# Patient Record
Sex: Female | Born: 1960 | Race: Black or African American | Hispanic: No | Smoking: Current every day smoker
Health system: Southern US, Community
[De-identification: ages and names within clinical notes are randomized; demographics above are authoritative.]

## PROBLEM LIST (undated history)

## (undated) DIAGNOSIS — I10 Essential (primary) hypertension: Secondary | ICD-10-CM

## (undated) DIAGNOSIS — I739 Peripheral vascular disease, unspecified: Secondary | ICD-10-CM

## (undated) DIAGNOSIS — L309 Dermatitis, unspecified: Secondary | ICD-10-CM

## (undated) DIAGNOSIS — E785 Hyperlipidemia, unspecified: Secondary | ICD-10-CM

## (undated) HISTORY — PX: BUNIONECTOMY: SHX129

## (undated) HISTORY — PX: TONSILLECTOMY: SUR1361

## (undated) HISTORY — PX: OTHER SURGICAL HISTORY: SHX169

---

## 2001-10-08 ENCOUNTER — Ambulatory Visit (HOSPITAL_COMMUNITY): Admission: RE | Admit: 2001-10-08 | Discharge: 2001-10-08 | Payer: Self-pay | Admitting: Family Medicine

## 2001-10-08 ENCOUNTER — Encounter: Payer: Self-pay | Admitting: Family Medicine

## 2014-05-17 ENCOUNTER — Other Ambulatory Visit (HOSPITAL_COMMUNITY): Payer: Self-pay | Admitting: Physician Assistant

## 2014-05-17 DIAGNOSIS — Z1231 Encounter for screening mammogram for malignant neoplasm of breast: Secondary | ICD-10-CM

## 2014-05-24 ENCOUNTER — Ambulatory Visit (HOSPITAL_COMMUNITY)
Admission: RE | Admit: 2014-05-24 | Discharge: 2014-05-24 | Disposition: A | Discharge status: Home / Self Care | Payer: Self-pay | Source: Ambulatory Visit | Attending: Physician Assistant | Admitting: Physician Assistant

## 2014-05-24 DIAGNOSIS — Z1231 Encounter for screening mammogram for malignant neoplasm of breast: Secondary | ICD-10-CM

## 2019-04-08 ENCOUNTER — Emergency Department (HOSPITAL_COMMUNITY)
Admission: EM | Admit: 2019-04-08 | Discharge: 2019-04-08 | Disposition: A | Discharge status: Home / Self Care | Payer: BC Managed Care – PPO | Attending: Emergency Medicine | Admitting: Emergency Medicine

## 2019-04-08 ENCOUNTER — Encounter (HOSPITAL_COMMUNITY): Payer: Self-pay | Admitting: Emergency Medicine

## 2019-04-08 ENCOUNTER — Other Ambulatory Visit: Payer: Self-pay

## 2019-04-08 DIAGNOSIS — F17210 Nicotine dependence, cigarettes, uncomplicated: Secondary | ICD-10-CM | POA: Insufficient documentation

## 2019-04-08 DIAGNOSIS — L089 Local infection of the skin and subcutaneous tissue, unspecified: Secondary | ICD-10-CM | POA: Insufficient documentation

## 2019-04-08 HISTORY — DX: Dermatitis, unspecified: L30.9

## 2019-04-08 MED ORDER — SULFAMETHOXAZOLE-TRIMETHOPRIM 800-160 MG PO TABS: 1.0000 | ORAL_TABLET | Freq: Two times a day (BID) | ORAL | 0 refills | Status: AC

## 2019-04-08 MED ORDER — SULFAMETHOXAZOLE-TRIMETHOPRIM 800-160 MG PO TABS
1.0000 | ORAL_TABLET | Freq: Once | ORAL | Status: AC
  Administered 2019-04-08: 15:00:00 1 via ORAL
  Filled 2019-04-08: qty 1

## 2019-04-08 NOTE — ED Notes (Signed)
Wet to dry dressing applied to open area to right lower leg.

## 2019-04-08 NOTE — ED Provider Notes (Addendum)
[facility] EMERGENCY DEPARTMENT Provider Note   CSN: [id number] Arrival date & time: 04/08/19  1338     History   Chief Complaint Chief Complaint  Patient presents with  . Wound Check    HPI Lauren Brown is a 58 y.o. female with history of eczema otherwise healthy presents today for wound of the left anterior lower leg.  Patient reports that she had a blister in this area 3 weeks ago that subsequently broke open and has been draining ever since.  She reports a mild throbbing pain to the area constant worsened with palpation without alleviating factors, no radiation of pain.  She reports a scant purulent drainage from the area that has been constant since it opened.  Denies fever/chills, headache, chest pain/shortness of breath, abdominal pain, nausea/vomiting, diarrhea, rash, insect/animal bite, tick bite, numbness/weakness, tingling or any additional concerns.     HPI  Past Medical History:  Diagnosis Date  . Eczema     There are no active problems to display for this patient.   Past Surgical History:  Procedure Laterality Date  . BUNIONECTOMY    . TONSILLECTOMY       OB History   No obstetric history on file.      Home Medications    Prior to Admission medications   Medication Sig Start Date End Date Taking? Authorizing Provider  augmented betamethasone dipropionate (DIPROLENE-AF) 0.05 % cream APPLY CREAM TOPICALLY TO HANDS TWICE DAILY AS NEEDED FOR ECZEMA NOT TO FACE GROIN OR UNDERARMS 02/11/19  Yes [provider]  triamcinolone cream (KENALOG) 0.1 % APPLY CREAM EXTERNALLY TWICE DAILY 10/17/18  Yes [provider]  sulfamethoxazole-trimethoprim (BACTRIM DS) 800-160 MG tablet Take 1 tablet by mouth 2 (two) times daily for 7 days. 04/08/19 04/15/19  Bill Salinas, PA-C    Family History History reviewed. No pertinent family history.  Social History Social History   Tobacco Use  . Smoking status: Current Every Day Smoker  .  Smokeless tobacco: Never Used  Substance Use Topics  . Alcohol use: Never    Frequency: Never  . Drug use: Never     Allergies   Patient has no known allergies.   Review of Systems Review of Systems Ten systems are reviewed and are negative for acute change except as noted in the HPI  Physical Exam Updated Vital Signs BP (!) 159/95 (BP Location: Right Arm)   Pulse 69   Temp 98.2 F (36.8 C) (Oral)   Resp 18   Ht 5\' 7"  (1.702 m)   Wt 79.4 kg   SpO2 99%   BMI 27.41 kg/m   Physical Exam Constitutional:      General: She is not in acute distress.    Appearance: Normal appearance. She is well-developed. She is not ill-appearing or diaphoretic.  HENT:     Head: Normocephalic and atraumatic.     Right Ear: External ear normal.     Left Ear: External ear normal.     Nose: Nose normal.  Eyes:     General: Vision grossly intact. Gaze aligned appropriately.     Pupils: Pupils are equal, round, and reactive to light.  Neck:     Musculoskeletal: Normal range of motion.     Trachea: Trachea and phonation normal. No tracheal deviation.  Cardiovascular:     Rate and Rhythm: Normal rate and regular rhythm.     Pulses:          Dorsalis pedis pulses are 2+ on the right  side and 2+ on the left side.  Pulmonary:     Effort: Pulmonary effort is normal. No respiratory distress.  Abdominal:     General: There is no distension.     Palpations: Abdomen is soft.     Tenderness: There is no abdominal tenderness. There is no guarding or rebound.  Musculoskeletal: Normal range of motion.  Feet:     Right foot:     Protective Sensation: 3 sites tested. 3 sites sensed.     Left foot:     Protective Sensation: 3 sites tested. 3 sites sensed.  Skin:    General: Skin is warm and dry.          Comments: 1.5 cm diameter wound of the left anterior lower leg with purulent drainage present.  Some hyperpigmented surrounding skin.  No streaking.  No fluctuance.  Neurological:     Mental  Status: She is alert.     GCS: GCS eye subscore is 4. GCS verbal subscore is 5. GCS motor subscore is 6.     Comments: Speech is clear and goal oriented, follows commands Major Cranial nerves without deficit, no facial droop Moves extremities without ataxia, coordination intact  Psychiatric:        Behavior: Behavior normal.      ED Treatments / Results  Labs (all labs ordered are listed, but only abnormal results are displayed) Labs Reviewed  AEROBIC/ANAEROBIC CULTURE (SURGICAL/DEEP WOUND)    EKG None  Radiology No results found.  Procedures Procedures (including critical care time)  Medications Ordered in ED Medications  sulfamethoxazole-trimethoprim (BACTRIM DS) 800-160 MG per tablet 1 tablet (1 tablet Oral Given 04/08/19 1433)     Initial Impression / Assessment and Plan / ED Course  I have reviewed the triage vital signs and the nursing notes.  Pertinent labs & imaging results that were available during my care of the patient were reviewed by me and considered in my medical decision making (see chart for details).    58 year old female with a three-week wound of the left anterior lower leg approximate 1.5 cm in diameter with purulent drainage.  No indication for incision and drainage as it is freely draining today.  She has no known immunocompromising diseases.  No systemic symptoms, well appearing, reassuring vital signs.  She is neurovascular intact to the extremity without sign of DVT, septic arthritis or compartment syndrome, does not meet SIRS crietera.  Wound to be dressed by nursing staff with wet-to-dry dressing.  Patient encouraged to continue this daily until infection resolves.  She has been started on Bactrim today and encouraged to follow-up with PCP and wound care center.  Referrals given.  Wound culture collected.  Of note patient denies chance of pregnancy today, she reports that she is postmenopausal and abstinent.  Patient states understanding that  medications given or prescribed today may result in harm to of a pregnancy and she accepts these risks and still chooses not to be pregnancy tested and proceed with medications.  Additionally patient denies history of CKD or gastric ulcers.  At this time there does not appear to be any evidence of an acute emergency medical condition and the patient appears stable for discharge with appropriate outpatient follow up. Diagnosis was discussed with patient who verbalizes understanding of care plan and is agreeable to discharge. I have discussed return precautions with patient who verbalizes understanding of return precautions. Patient encouraged to follow-up with their PCP and wound center. All questions answered. Patient has been discharged in  good condition.  Patient seen and evaluated by Dr. Sabra Heck during this visit who agrees with Bactrim and outpatient follow-up.  Note: Portions of this report may have been transcribed using voice recognition software. Every effort was made to ensure accuracy; however, inadvertent computerized transcription errors may still be present. Final Clinical Impressions(s) / ED Diagnoses   Final diagnoses:  Wound infection    ED Discharge Orders         Ordered    sulfamethoxazole-trimethoprim (BACTRIM DS) 800-160 MG tablet  2 times daily     04/08/19 1458           Deliah Boston, PA-C 04/08/19 1502    Deliah Boston, PA-C 04/08/19 1519    Noemi Chapel, MD 04/09/19 [phone]

## 2019-04-08 NOTE — Discharge Instructions (Signed)
You have been diagnosed today with wound infection.  At this time there does not appear to be the presence of an emergent medical condition, however there is always the potential for conditions to change. Please read and follow the below instructions.  Please return to the Emergency Department immediately for any new or worsening symptoms. Please be sure to follow up with your Primary Care Provider within one week regarding your visit today; please call their office to schedule an appointment even if you are feeling better for a follow-up visit.  You may call the [facility] on your discharge paperwork or use the resource guide given to you today to find a local primary care doctor. Please continue to use the wound care techniques and wet to dry dressing taught to you today to help with your wound.  Elevation of your leg may also help with your symptoms. Please take the antibiotic Bactrim as prescribed to help treat your infection.  Be sure to drink plenty of water while taking this medication.  Get help right away if: You have a red streak going away from your wound. The edges of the wound open up and separate. Your wound is bleeding, and the bleeding does not stop with gentle pressure. You have a rash. You faint. You have trouble breathing. You have fever or chills You have any new/concerning or worsening symptoms.  Please read the additional information packets attached to your discharge summary.  Do not take your medicine if  develop an itchy rash, swelling in your mouth or lips, or difficulty breathing; call 911 and seek immediate emergency medical attention if this occurs.  Note: Portions of this text may have been transcribed using voice recognition software. Every effort was made to ensure accuracy; however, inadvertent computerized transcription errors may still be present.

## 2019-04-08 NOTE — ED Provider Notes (Signed)
The patient has a wound to the left lower extremity, has opened up after a blister formed, has an ongoing open wound which is somewhat tender to palpation, minimal surrounding redness, no fevers.  Will treat with antibiotics and have close follow-up.  Will be given family doctor follow-up for the area.  Patient agreeable.  Medical screening examination/treatment/procedure(s) were conducted as a shared visit with non-physician practitioner(s) and myself.  I personally evaluated the patient during the encounter.  Clinical Impression:   Final diagnoses:  Wound infection         Noemi Chapel, MD 04/09/19 [phone]

## 2019-04-08 NOTE — ED Triage Notes (Signed)
Patient states she had a blister on her left lower leg 3 weeks ago and it's now an open wound that won't heal. Complaining of swelling to area around wound.

## 2019-04-13 LAB — AEROBIC/ANAEROBIC CULTURE W GRAM STAIN (SURGICAL/DEEP WOUND): Special Requests: NORMAL

## 2019-04-13 LAB — AEROBIC/ANAEROBIC CULTURE (SURGICAL/DEEP WOUND)

## 2019-04-14 ENCOUNTER — Telehealth: Payer: Self-pay | Admitting: *Deleted

## 2019-04-14 NOTE — Telephone Encounter (Signed)
Post ED Visit - Positive Culture Follow-up  Culture report reviewed by antimicrobial stewardship pharmacist: [place] Team []  Elenor Quinones, Pharm.D. []  Heide Guile, Pharm.D., BCPS AQ-ID []  Parks Neptune, Pharm.D., BCPS []  Alycia Rossetti, Pharm.D., BCPS []  [place], Pharm.D., BCPS, AAHIVP []  Legrand Como, Pharm.D., BCPS, AAHIVP []  Salome Arnt, PharmD, BCPS []  Johnnette Gourd, PharmD, BCPS []  Hughes Better, PharmD, BCPS []  Leeroy Cha, PharmD []  Laqueta Linden, PharmD, BCPS []  Albertina Parr, PharmD  [place] Team []  Leodis Sias, PharmD []  Lindell Spar, PharmD []  Royetta Asal, PharmD []  Graylin Shiver, Rph []  Rema Fendt) Glennon Mac, PharmD []  Arlyn Dunning, PharmD []  Netta Cedars, PharmD []  Dia Sitter, PharmD []  Leone Haven, PharmD []  Gretta Arab, PharmD []  Theodis Shove, PharmD []  Peggyann Juba, PharmD []  Reuel Boom, PharmD   Positive wound culture, Duanne Limerick. PharmD  Treated with Sulfamethoxazole-Trimethoprim, organism sensitive to the same and no further patient follow-up is required at this time.  Harlon Flor [facility] 04/14/2019, 10:59 AM

## 2019-05-01 ENCOUNTER — Encounter (HOSPITAL_BASED_OUTPATIENT_CLINIC_OR_DEPARTMENT_OTHER): Payer: BC Managed Care – PPO | Admitting: Internal Medicine

## 2019-06-16 DIAGNOSIS — L98499 Non-pressure chronic ulcer of skin of other sites with unspecified severity: Secondary | ICD-10-CM | POA: Insufficient documentation

## 2019-06-16 DIAGNOSIS — Z720 Tobacco use: Secondary | ICD-10-CM | POA: Insufficient documentation

## 2019-06-27 ENCOUNTER — Emergency Department (HOSPITAL_COMMUNITY): Payer: BC Managed Care – PPO

## 2019-06-27 ENCOUNTER — Inpatient Hospital Stay (HOSPITAL_COMMUNITY)
Admission: EM | Admit: 2019-06-27 | Discharge: 2019-07-01 | DRG: 871 | Disposition: A | Discharge status: Home Health | Payer: BC Managed Care – PPO | Attending: Internal Medicine | Admitting: Internal Medicine

## 2019-06-27 ENCOUNTER — Other Ambulatory Visit: Payer: Self-pay

## 2019-06-27 DIAGNOSIS — I6330 Cerebral infarction due to thrombosis of unspecified cerebral artery: Secondary | ICD-10-CM | POA: Insufficient documentation

## 2019-06-27 DIAGNOSIS — R739 Hyperglycemia, unspecified: Secondary | ICD-10-CM | POA: Diagnosis present

## 2019-06-27 DIAGNOSIS — X58XXXA Exposure to other specified factors, initial encounter: Secondary | ICD-10-CM | POA: Diagnosis not present

## 2019-06-27 DIAGNOSIS — G9341 Metabolic encephalopathy: Secondary | ICD-10-CM | POA: Diagnosis present

## 2019-06-27 DIAGNOSIS — I639 Cerebral infarction, unspecified: Secondary | ICD-10-CM | POA: Diagnosis present

## 2019-06-27 DIAGNOSIS — D649 Anemia, unspecified: Secondary | ICD-10-CM | POA: Diagnosis present

## 2019-06-27 DIAGNOSIS — I10 Essential (primary) hypertension: Secondary | ICD-10-CM | POA: Diagnosis present

## 2019-06-27 DIAGNOSIS — Z95820 Peripheral vascular angioplasty status with implants and grafts: Secondary | ICD-10-CM

## 2019-06-27 DIAGNOSIS — L309 Dermatitis, unspecified: Secondary | ICD-10-CM | POA: Diagnosis present

## 2019-06-27 DIAGNOSIS — Z8249 Family history of ischemic heart disease and other diseases of the circulatory system: Secondary | ICD-10-CM

## 2019-06-27 DIAGNOSIS — Z8661 Personal history of infections of the central nervous system: Secondary | ICD-10-CM

## 2019-06-27 DIAGNOSIS — A4101 Sepsis due to Methicillin susceptible Staphylococcus aureus: Principal | ICD-10-CM | POA: Diagnosis present

## 2019-06-27 DIAGNOSIS — F17200 Nicotine dependence, unspecified, uncomplicated: Secondary | ICD-10-CM | POA: Diagnosis present

## 2019-06-27 DIAGNOSIS — Z79899 Other long term (current) drug therapy: Secondary | ICD-10-CM | POA: Diagnosis not present

## 2019-06-27 DIAGNOSIS — E876 Hypokalemia: Secondary | ICD-10-CM | POA: Diagnosis present

## 2019-06-27 DIAGNOSIS — Z20822 Contact with and (suspected) exposure to covid-19: Secondary | ICD-10-CM | POA: Diagnosis present

## 2019-06-27 DIAGNOSIS — E0781 Sick-euthyroid syndrome: Secondary | ICD-10-CM | POA: Diagnosis present

## 2019-06-27 DIAGNOSIS — I739 Peripheral vascular disease, unspecified: Secondary | ICD-10-CM | POA: Diagnosis present

## 2019-06-27 DIAGNOSIS — L03116 Cellulitis of left lower limb: Secondary | ICD-10-CM | POA: Diagnosis present

## 2019-06-27 DIAGNOSIS — G9340 Encephalopathy, unspecified: Secondary | ICD-10-CM | POA: Diagnosis not present

## 2019-06-27 DIAGNOSIS — E785 Hyperlipidemia, unspecified: Secondary | ICD-10-CM | POA: Diagnosis present

## 2019-06-27 DIAGNOSIS — E538 Deficiency of other specified B group vitamins: Secondary | ICD-10-CM | POA: Diagnosis present

## 2019-06-27 DIAGNOSIS — R4182 Altered mental status, unspecified: Secondary | ICD-10-CM | POA: Diagnosis present

## 2019-06-27 DIAGNOSIS — S81802A Unspecified open wound, left lower leg, initial encounter: Secondary | ICD-10-CM | POA: Diagnosis not present

## 2019-06-27 DIAGNOSIS — R7881 Bacteremia: Secondary | ICD-10-CM | POA: Diagnosis not present

## 2019-06-27 DIAGNOSIS — R509 Fever, unspecified: Secondary | ICD-10-CM | POA: Diagnosis not present

## 2019-06-27 DIAGNOSIS — B9561 Methicillin susceptible Staphylococcus aureus infection as the cause of diseases classified elsewhere: Secondary | ICD-10-CM | POA: Diagnosis present

## 2019-06-27 DIAGNOSIS — I2510 Atherosclerotic heart disease of native coronary artery without angina pectoris: Secondary | ICD-10-CM | POA: Diagnosis present

## 2019-06-27 DIAGNOSIS — L089 Local infection of the skin and subcutaneous tissue, unspecified: Secondary | ICD-10-CM | POA: Diagnosis not present

## 2019-06-27 DIAGNOSIS — R410 Disorientation, unspecified: Secondary | ICD-10-CM

## 2019-06-27 DIAGNOSIS — I6522 Occlusion and stenosis of left carotid artery: Secondary | ICD-10-CM | POA: Diagnosis not present

## 2019-06-27 HISTORY — DX: Hyperlipidemia, unspecified: E78.5

## 2019-06-27 HISTORY — DX: Peripheral vascular disease, unspecified: I73.9

## 2019-06-27 HISTORY — DX: Essential (primary) hypertension: I10

## 2019-06-27 LAB — COMPREHENSIVE METABOLIC PANEL
ALT: 13 U/L (ref 0–44)
AST: 18 U/L (ref 15–41)
Albumin: 3.5 g/dL (ref 3.5–5.0)
Alkaline Phosphatase: 82 U/L (ref 38–126)
Anion gap: 14 (ref 5–15)
BUN: 12 mg/dL (ref 6–20)
CO2: 22 mmol/L (ref 22–32)
Calcium: 8.3 mg/dL — ABNORMAL LOW (ref 8.9–10.3)
Chloride: 97 mmol/L — ABNORMAL LOW (ref 98–111)
Creatinine, Ser: 0.88 mg/dL (ref 0.44–1.00)
GFR calc Af Amer: >60 mL/min (ref >60)
GFR calc non Af Amer: >60 mL/min (ref >60)
Glucose, Bld: 117 mg/dL — ABNORMAL HIGH (ref 70–99)
Potassium: 3.3 mmol/L — ABNORMAL LOW (ref 3.5–5.1)
Sodium: 133 mmol/L — ABNORMAL LOW (ref 135–145)
Total Bilirubin: 1.3 mg/dL — ABNORMAL HIGH (ref 0.3–1.2)
Total Protein: 7.9 g/dL (ref 6.5–8.1)

## 2019-06-27 LAB — APTT: aPTT: 31 seconds (ref 24–36)

## 2019-06-27 LAB — PROCALCITONIN: Procalcitonin: 2.5 ng/mL

## 2019-06-27 LAB — URINALYSIS, ROUTINE W REFLEX MICROSCOPIC
Bilirubin Urine: NEGATIVE
Glucose, UA: NEGATIVE mg/dL
Ketones, ur: 5 mg/dL — AB
Leukocytes,Ua: NEGATIVE
Nitrite: NEGATIVE
Protein, ur: 30 mg/dL — AB
Specific Gravity, Urine: 1.016 (ref 1.005–1.030)
pH: 5 (ref 5.0–8.0)

## 2019-06-27 LAB — CBC WITH DIFFERENTIAL/PLATELET
Abs Immature Granulocytes: 0.08 10*3/uL — ABNORMAL HIGH (ref 0.00–0.07)
Basophils Absolute: 0 10*3/uL (ref 0.0–0.1)
Basophils Relative: 0 %
Eosinophils Absolute: 0 10*3/uL (ref 0.0–0.5)
Eosinophils Relative: 0 %
HCT: 39.8 % (ref 36.0–46.0)
Hemoglobin: 12.6 g/dL (ref 12.0–15.0)
Immature Granulocytes: 1 %
Lymphocytes Relative: 6 %
Lymphs Abs: 1.1 10*3/uL (ref 0.7–4.0)
MCH: 27.3 pg (ref 26.0–34.0)
MCHC: 31.7 g/dL (ref 30.0–36.0)
MCV: 86.1 fL (ref 80.0–100.0)
Monocytes Absolute: 0.7 10*3/uL (ref 0.1–1.0)
Monocytes Relative: 4 %
Neutro Abs: 15.3 10*3/uL — ABNORMAL HIGH (ref 1.7–7.7)
Neutrophils Relative %: 89 %
Platelets: 264 10*3/uL (ref 150–400)
RBC: 4.62 MIL/uL (ref 3.87–5.11)
RDW: 13.2 % (ref 11.5–15.5)
WBC: 17.1 10*3/uL — ABNORMAL HIGH (ref 4.0–10.5)
nRBC: 0 % (ref 0.0–0.2)

## 2019-06-27 LAB — POC URINE PREG, ED: Preg Test, Ur: NEGATIVE

## 2019-06-27 LAB — PROTIME-INR
INR: 1.2 (ref 0.8–1.2)
Prothrombin Time: 15.4 seconds — ABNORMAL HIGH (ref 11.4–15.2)

## 2019-06-27 LAB — D-DIMER, QUANTITATIVE: D-Dimer, Quant: 2.22 ug/mL-FEU — ABNORMAL HIGH (ref 0.00–0.50)

## 2019-06-27 LAB — C-REACTIVE PROTEIN: CRP: 11.1 mg/dL — ABNORMAL HIGH (ref <1.0)

## 2019-06-27 LAB — SEDIMENTATION RATE: Sed Rate: 41 mm/hr — ABNORMAL HIGH (ref 0–22)

## 2019-06-27 LAB — LACTIC ACID, PLASMA: Lactic Acid, Venous: 1.8 mmol/L (ref 0.5–1.9)

## 2019-06-27 IMAGING — DX DG CHEST 1V PORT
1 series · 1 of 1 positions shown · non-contrast
Comparison: [DATE], report only

CLINICAL DATA: Fever.  Left leg wound.

EXAM:
PORTABLE CHEST 1 VIEW

[chest ap]
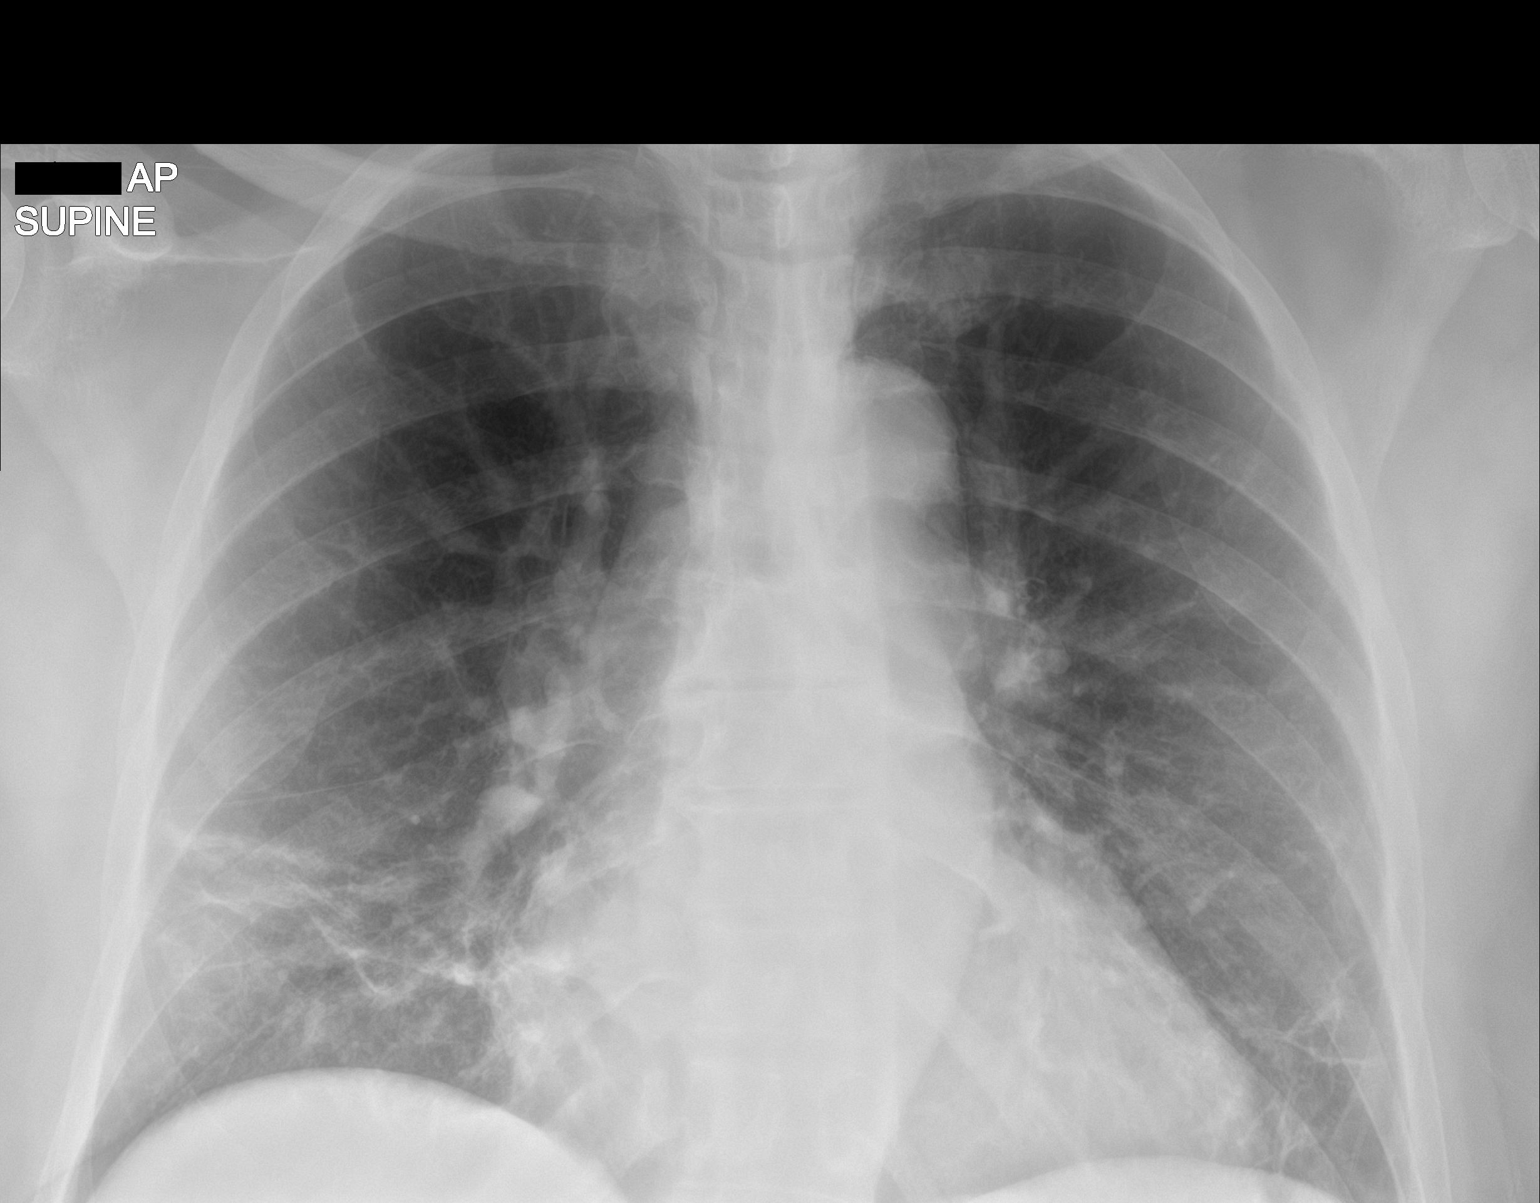

[1 of 1 positions shown; findings below may reference images not displayed]

FINDINGS: AP portable chest film. The costophrenic angles and inferior-most
chest are excluded. Midline trachea. Mild cardiomegaly. Tortuous
thoracic aorta. No large pleural fluid. No pneumothorax. Basilar
predominant areas of linear interstitial thickening and scarring. No
lobar consolidation.
IMPRESSION: No acute cardiopulmonary disease.

Cardiomegaly without congestive failure.

## 2019-06-27 IMAGING — CT CT HEAD W/O CM
4 series · 17 of 47 positions shown, 19 images · non-contrast
Comparison: None.

CLINICAL DATA: Stroke-like symptoms

EXAM:
CT HEAD WITHOUT CONTRAST
TECHNIQUE: Contiguous axial images were obtained from the base of the skull
through the vertex without intravenous contrast.

[Series 2: head w o · axial · 0.47mm/px · z∈[+758,+873]mm · 7 of 31 slices shown, 9 images]
[im 4/31  brain]
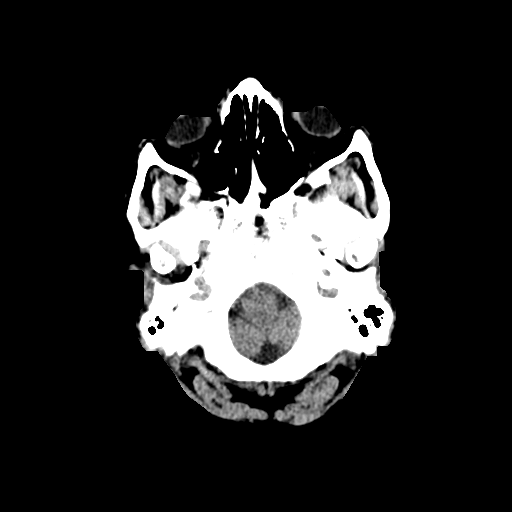
[im 4/31  bone]
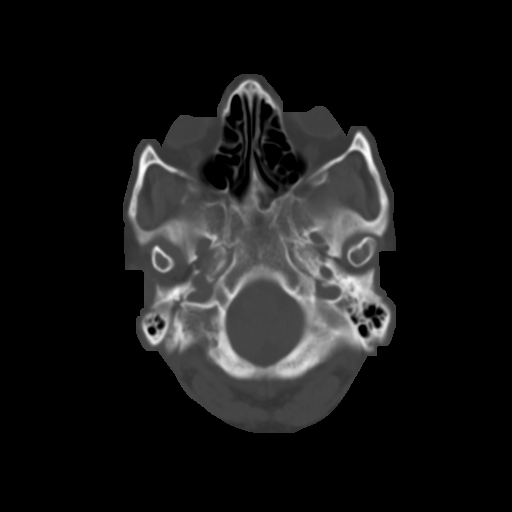
[im 8/31  brain]
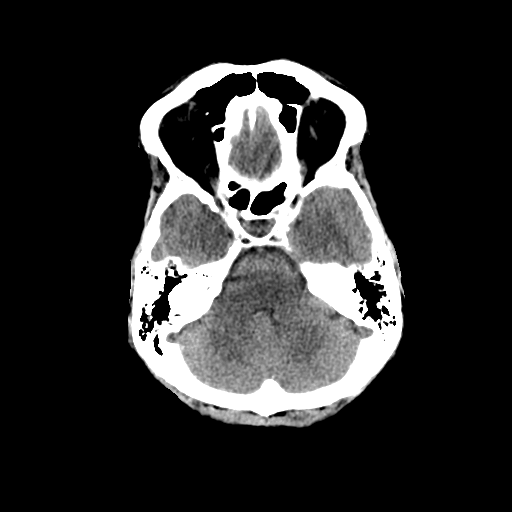
[im 12/31  brain]
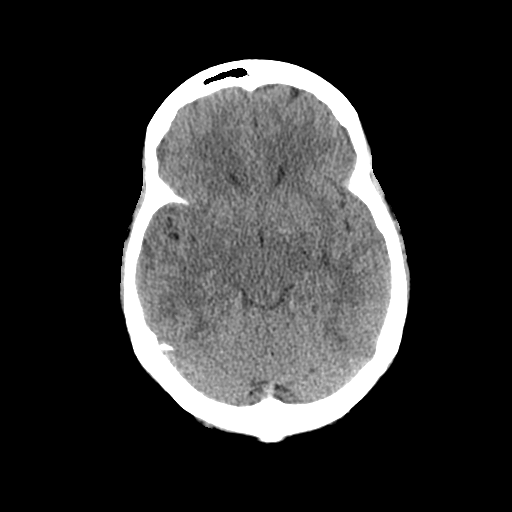
[im 16/31  brain]
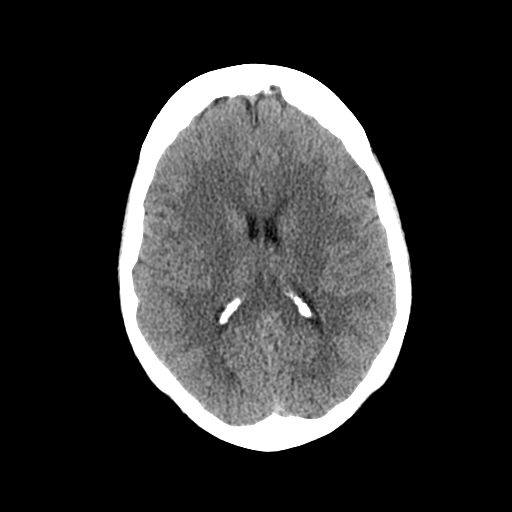
[im 19/31  brain]
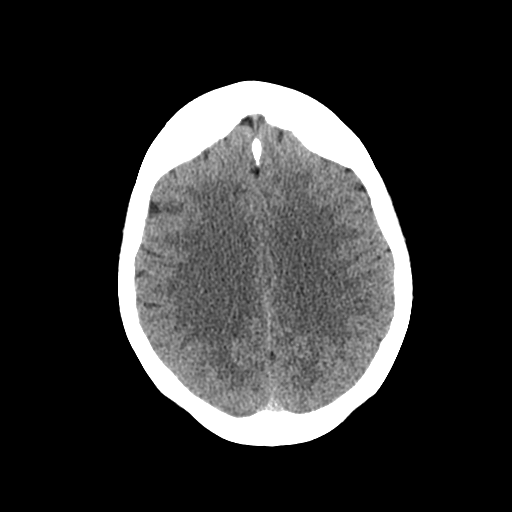
[im 19/31  bone]
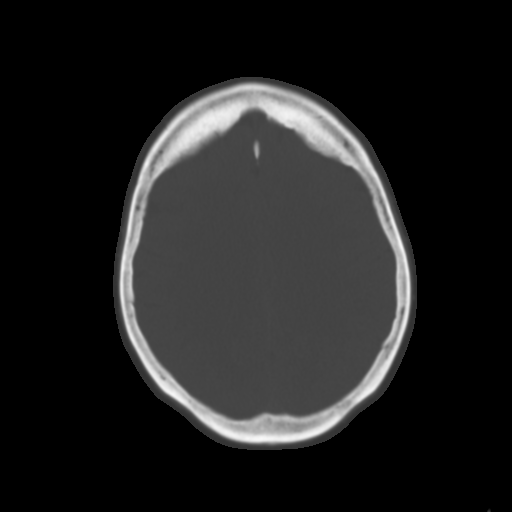
[im 23/31  brain]
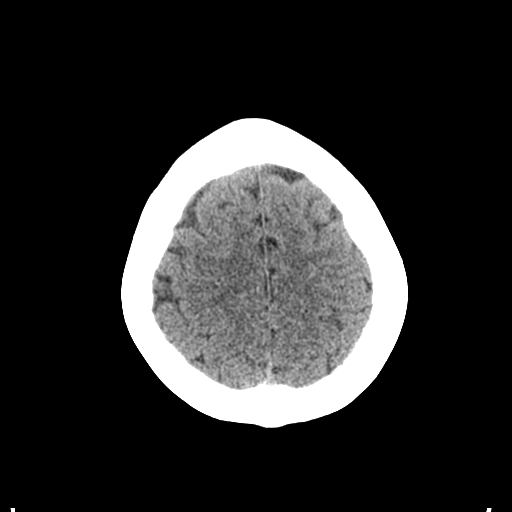
[im 27/31  brain]
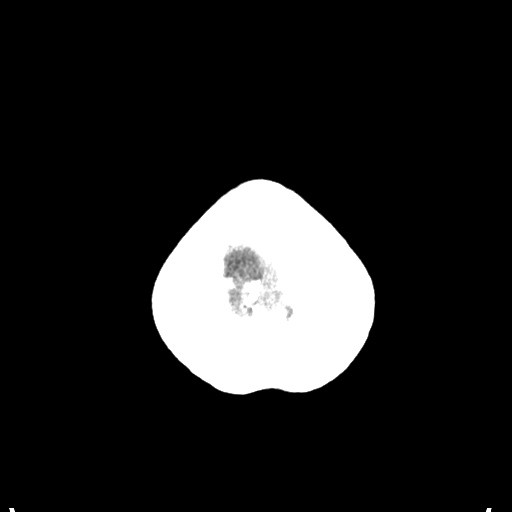

[Series 3: head bone · axial · 0.47mm/px · z∈[+757,+811]mm · 4 of 78 slices shown]
[im 8/78  bone]
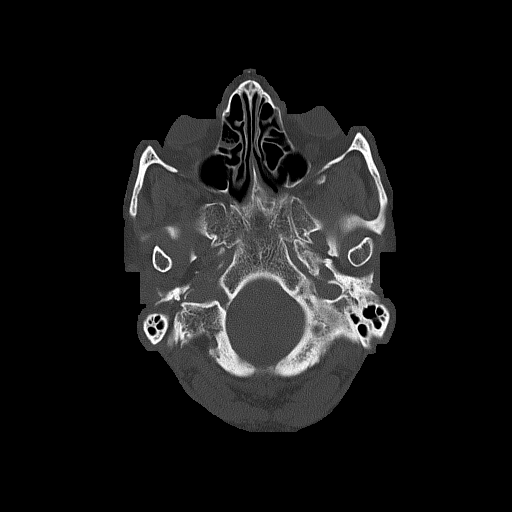
[im 16/78  bone]
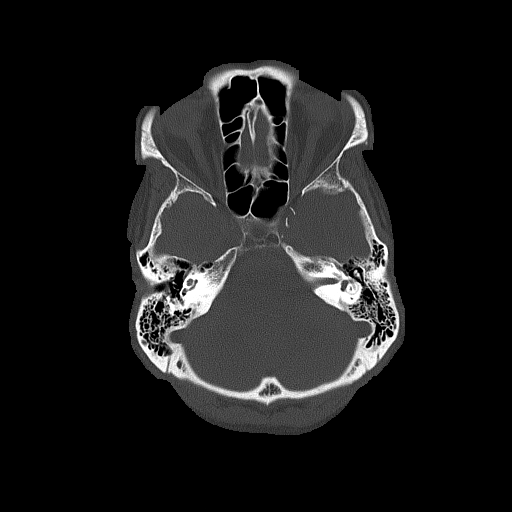
[im 24/78  bone]
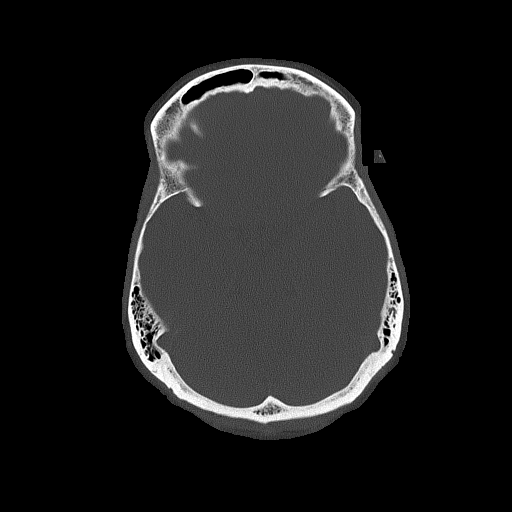
[im 35/78  bone]
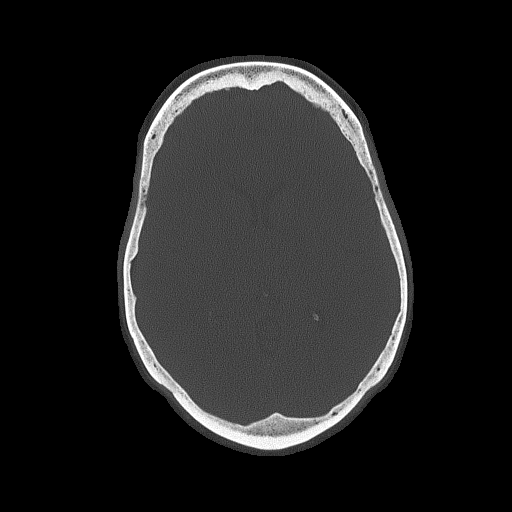

[Series 4: coronal soft · coronal · 0.33mm/px · 3 of 70 slices shown]
[im 24/70  brain]
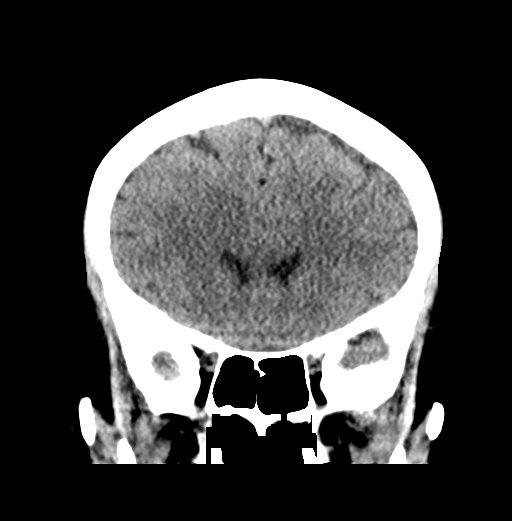
[im 31/70  brain]
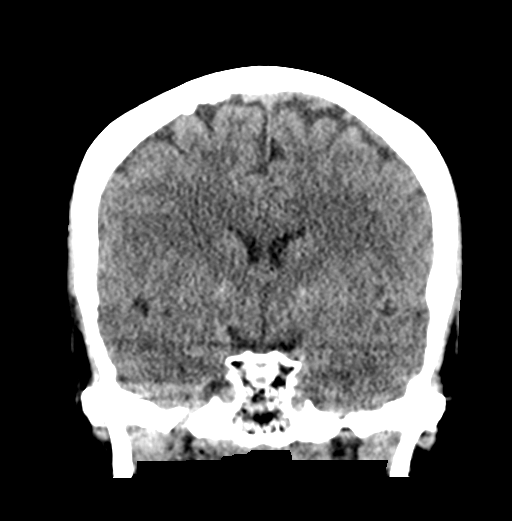
[im 39/70  brain]
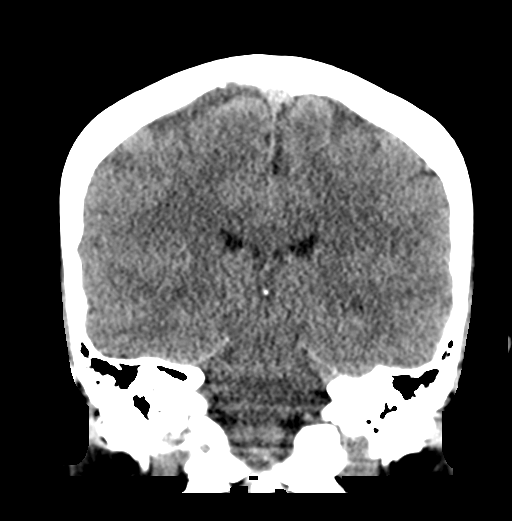

[Series 5: sagittal soft · sagittal · 0.35mm/px · 3 of 55 slices shown]
[im 19/55  brain]
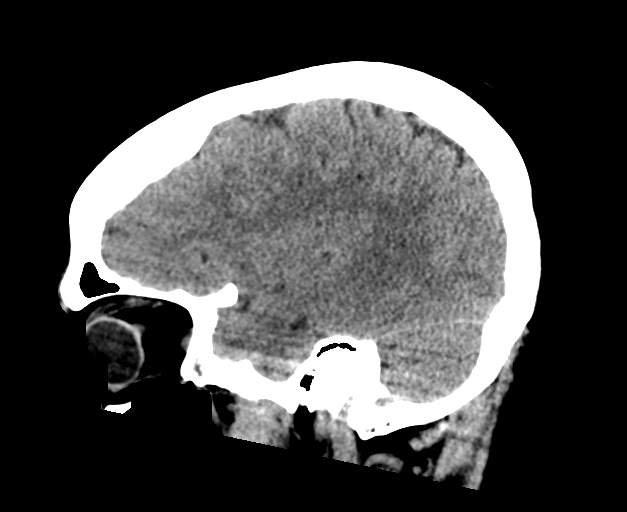
[im 28/55  brain]
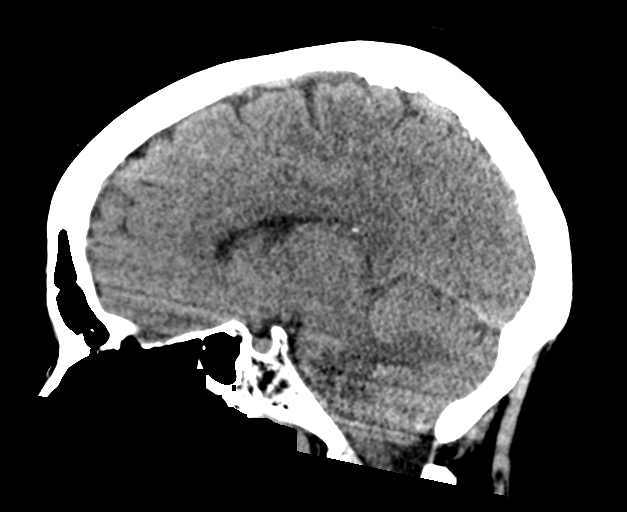
[im 37/55  brain]
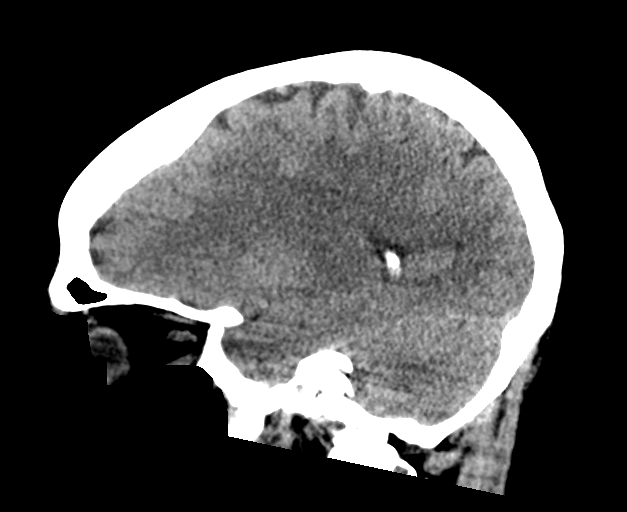

[17 of 47 positions shown; findings below may reference images not displayed]

FINDINGS: Brain: No evidence of acute infarction, hemorrhage, hydrocephalus,
extra-axial collection or mass lesion/mass effect.

Vascular: No hyperdense vessel or unexpected calcification.

Skull: Normal. Negative for fracture or focal lesion.

Sinuses/Orbits: No acute finding.

Other: None.
IMPRESSION: No acute intracranial abnormality noted.

## 2019-06-27 MED ORDER — COLLAGENASE 250 UNIT/GM EX OINT
1.0000 "application " | TOPICAL_OINTMENT | CUTANEOUS | Status: DC
  Administered 2019-07-01: 1 via TOPICAL
  Filled 2019-06-27 (×2): qty 30

## 2019-06-27 MED ORDER — SODIUM CHLORIDE 0.9 % IV SOLN
2.0000 g | Freq: Once | INTRAVENOUS | Status: AC
  Administered 2019-06-27: 2 g via INTRAVENOUS
  Filled 2019-06-27: qty 20

## 2019-06-27 MED ORDER — SODIUM CHLORIDE 0.9% FLUSH: 3.0000 mL | INTRAVENOUS | Status: DC | PRN

## 2019-06-27 MED ORDER — POLYETHYLENE GLYCOL 3350 17 G PO PACK: 17.0000 g | PACK | Freq: Every day | ORAL | Status: DC | PRN

## 2019-06-27 MED ORDER — ONDANSETRON HCL 4 MG/2ML IJ SOLN: 4.0000 mg | Freq: Four times a day (QID) | INTRAMUSCULAR | Status: DC | PRN

## 2019-06-27 MED ORDER — ONDANSETRON HCL 4 MG PO TABS: 4.0000 mg | ORAL_TABLET | Freq: Four times a day (QID) | ORAL | Status: DC | PRN

## 2019-06-27 MED ORDER — ACETAMINOPHEN 650 MG RE SUPP: 650.0000 mg | Freq: Four times a day (QID) | RECTAL | Status: DC | PRN

## 2019-06-27 MED ORDER — SIMVASTATIN 20 MG PO TABS
20.0000 mg | ORAL_TABLET | Freq: Every day | ORAL | Status: DC
  Administered 2019-06-28 – 2019-06-30 (×3): 20 mg via ORAL
  Filled 2019-06-27 (×3): qty 1

## 2019-06-27 MED ORDER — VANCOMYCIN HCL 1500 MG/300ML IV SOLN
1500.0000 mg | Freq: Once | INTRAVENOUS | Status: AC
  Administered 2019-06-27: 1500 mg via INTRAVENOUS
  Filled 2019-06-27: qty 300

## 2019-06-27 MED ORDER — ASPIRIN EC 81 MG PO TBEC
81.0000 mg | DELAYED_RELEASE_TABLET | Freq: Every day | ORAL | Status: DC
  Filled 2019-06-27 (×3): qty 1

## 2019-06-27 MED ORDER — BISACODYL 10 MG RE SUPP: 10.0000 mg | Freq: Every day | RECTAL | Status: DC | PRN

## 2019-06-27 MED ORDER — SODIUM CHLORIDE 0.9 % IV SOLN: 250.0000 mL | INTRAVENOUS | Status: DC | PRN

## 2019-06-27 MED ORDER — SODIUM CHLORIDE 0.9 % IV SOLN
2.0000 g | INTRAVENOUS | Status: DC
  Administered 2019-06-28: 2 g via INTRAVENOUS
  Filled 2019-06-27: qty 2
  Filled 2019-06-27: qty 20

## 2019-06-27 MED ORDER — SODIUM CHLORIDE 0.9% FLUSH
3.0000 mL | Freq: Two times a day (BID) | INTRAVENOUS | Status: DC
  Administered 2019-06-28 – 2019-07-01 (×3): 3 mL via INTRAVENOUS

## 2019-06-27 MED ORDER — LACTATED RINGERS IV SOLN: INTRAVENOUS | Status: DC

## 2019-06-27 MED ORDER — ACETAMINOPHEN 325 MG PO TABS
650.0000 mg | ORAL_TABLET | Freq: Four times a day (QID) | ORAL | Status: DC | PRN
  Administered 2019-06-28: 650 mg via ORAL
  Filled 2019-06-27: qty 2

## 2019-06-27 MED ORDER — VANCOMYCIN HCL IN DEXTROSE 1-5 GM/200ML-% IV SOLN
1000.0000 mg | Freq: Two times a day (BID) | INTRAVENOUS | Status: DC
  Administered 2019-06-28 – 2019-06-30 (×5): 1000 mg via INTRAVENOUS
  Filled 2019-06-27 (×5): qty 200

## 2019-06-27 MED ORDER — ENOXAPARIN SODIUM 40 MG/0.4ML ~~LOC~~ SOLN
40.0000 mg | SUBCUTANEOUS | Status: DC
  Administered 2019-06-28 – 2019-06-30 (×3): 40 mg via SUBCUTANEOUS
  Filled 2019-06-27 (×3): qty 0.4

## 2019-06-27 MED ORDER — CLOPIDOGREL BISULFATE 75 MG PO TABS: 75.0000 mg | ORAL_TABLET | Freq: Every day | ORAL | Status: DC

## 2019-06-27 NOTE — ED Triage Notes (Signed)
EMS gave 1 gram tylenol for fever 102 at home.

## 2019-06-27 NOTE — Consult Note (Signed)
[facility] Consult Services  Stat Consult  Date of Service:   06/27/2019 19:19:10  Impression:     .  G93.49 - Encephalopathy Multifactorial  Comments/Sign-Out: On examination, it appears that she has metabolic encephalopathy due to underlying fever with elevated white cell count. CNS infections also to come in mind. Plain CT of the head was reported as negative. I would suggest continuing her on antibiotics and covering her empirically with Rocephin, vancomycin, ampicillin and acyclovir, getting an MRI of the head and an ID consultation. If needed and no other clear-cut source of infection is found, I would suggest getting a lumbar puncture also. At this point she is very agitated and I do not think it will be easy to do spinal tap on her without heavy sedation. I discussed all this in detail with the ED attending.  CT HEAD: Showed No Acute Hemorrhage or Acute Core Infarct  Metrics: [facility] Notification Time: 06/27/2019 19:19:10 Stamp Time: 06/27/2019 19:19:10 Callback Response Time: 06/27/2019 19:20:16  Our recommendations are outlined below.  Imaging Studies:     .  MRI Head  Therapies:     .  Physical Therapy     .  Occupational Therapy     .  Speech Therapy  Other WorkUp:     .  Infectious/metabolic workup per primary team     .  Check an ammonia level     .  Check B12 level     .  Check TSH  Disposition: Neurology Follow Up Recommended  Sign Out:     .  Discussed with Emergency Department Provider  ----------------------------------------------------------------------------------------------------  Chief Complaint: AMS  History of Present Illness: Patient is a 59 year old Female.  59 year old female with past medical history of hypertension, hyperlipidemia, peripheral vascular disease, brought to the hospital by the family because of some confusion. Patient has been confused and disoriented. She is following some  commands but appears to be very irritable. She is trying to climb out of the bed and gets disoriented very easily. She has a fever of 102.1 with a white cell count of 17. Also appears to have a wound on her left leg. She denies any headaches. She denies any focal weakness.    Past Medical History:     . Hypertension     . Hyperlipidemia     . Coronary Artery Disease  Anticoagulant use:  No  Antiplatelet use: Aspirin and Plavix     Examination: BP(157/93), Pulse(108), Blood Glucose(117)  Neuro Exam:     Neurological examination showed she is awake, neck is supple, no neck rigidity was noted. She is disoriented. She is following some commands but gets irritated very easily. She is constantly trying to get out of bed. She has no obvious facial asymmetry. Has no gaze deviation. Tongue appears midline. She is moving all 4 extremities symmetrically   Due to the immediate potential for life-threatening deterioration due to underlying acute neurologic illness, I spent 30 minutes providing critical care. This time includes time for face to face visit via telemedicine, review of medical records, imaging studies and discussion of findings with providers, the patient and/or family.   Dr Hedy Camara   [facility] [phone]  Case [id number]

## 2019-06-27 NOTE — H&P (Signed)
History and Physical    Patient Demographics:    Lauren Brown LFY:[id number] DOB: Jul 27, 1960 DOA: 06/27/2019  PCP: Patient, No Pcp Per  Patient coming from: Home  I have personally briefly reviewed patient's old medical records in [facility]  Chief Complaint: Altered mental status   Assessment & Plan:     Assessment/Plan Principal Problem:   AMS (altered mental status) Active Problems:   PVD (peripheral vascular disease) (HCC)   Essential hypertension   Hyperlipidemia     Principal Problem: Altered Mental Status Patient presented with increased confusion, incoherent speech, lethargy.  Noted to be confused at the time of my exam.  No obvious focal deficits on exam except for possible decreased visual acuity on right eye. Recently had left common iliac artery stent placement as noted below.  Does have a fever on presentation.  Etiology is unclear but appears to be metabolic encephalopathy.  Differential diagnosis include neuro infection versus CVA.  CT of the head is negative. We will admit the patient to [facility] telemetry unit given lack of MRI facilities at [facility]. Plan: Telemetry monitoring Neurochecks every 4 hours We will keep n.p.o. PT/OT/speech evaluation Plan for MRI brain in a.m. Will need official neurology consult Lumbar puncture has been ordered  Other Active Problems: Sepsis, unclear etiology Patient noted to have fever, leukocytosis, mild tachycardia on presentation.  Lactic acid is normal however procalcitonin is elevated.  Urinalysis shows no evidence of UTI, chest x-ray shows no evidence of pneumonia, Possibly left lower extremity wound infection vs CNS infection versus bacteremia following recent surgical procedure.  There is also concern for possible underlying osteomyelitis. Will need to follow blood cultures, urine cultures Wound culture has been ordered Continue IV antibiotics Plan for ID consult Consider left lower extremity MRI on  transfer  Peripheral vascular disease Patient had left lower extremity limb threatening ischemia and had a left lower extremity arteriogram with left common iliac artery stent placement on 06/24/2019 at [facility] cardiovascular unit as per outpatient records. Currently has no evidence of acute ischemia.  Pulses felt bilaterally, good capillary refill. We will continue aspirin, Plavix  Hyperlipidemia: Continue simvastatin   DVT prophylaxis: Lovenox Code Status:  Full code Family Communication: N/A  Disposition Plan: Admitted as inpatient to [facility] for MRI, ID consult Consults called: N/A Admission status: Inpatient status    HPI:     HPI: Lauren Brown is a 59 y.o. female with medical history significant of peripheral vascular disease, hypertension, hyperlipidemia who presented to the ER with altered mental status.  Patient has a history of peripheral vascular disease and had a right iliac artery stent placement on 06/24/2019 at [facility].  At baseline she is usually alert and oriented and has been having worsening mental status over the last 2 days.  History is being obtained mainly from the ER record and the family as the patient is unable to provide details.  She was apparently noted to be sleeping all day yesterday and did not feel like her usual self.  This morning she was noted to be confused and was babbling incoherently and had slurring of speech.  Was noted to have a low-grade temperature on arrival.  No nausea, vomiting, abdominal pain, diarrhea, dysuria, hematemesis, melena, hematochezia, syncope, seizures, dizziness, lightheadedness was reported.  No falls, trauma, head injury, neck pain was reported.  No tingling, numbness, vision change, focal weakness. ED Course:  Vital Signs reviewed on presentation, significant for temperature 100.2, blood pressure 157/72, heart rate 91,  saturation 97% on room air. Labs reviewed, significant for sodium 133, potassium 3.3, chloride 97,  BUN 12, creatinine 0.8, LFTs within normal limits, lactic acid 1.8, procalcitonin 2.5, WBC count 17.1, hemoglobin 12.6, hematocrit 39, platelets 264, D-dimer 2.22, INR 1.2, PTT 31, urine pregnancy test is negative, SARS-CoV-2 RT-PCR is pending, urinalysis shows no significant abnormalities, blood and urine cultures have been sent. Imaging personally Reviewed, CT of the head shows no acute intracranial abnormalities.  Chest x-ray shows basilar interstitial thickening and scarring but no other obvious infiltrates.  Mild cardiomegaly noted. EKG personally reviewed, shows sinus rhythm, right axis deviation,    Review of systems:    Review of Systems: Unable to do review of systems due to altered mental status    Past Medical and Surgical History:  Reviewed by me  Past Medical History:  Diagnosis Date  . Eczema     Past Surgical History:  Procedure Laterality Date  . BUNIONECTOMY    . TONSILLECTOMY       Social History:  Reviewed by me   reports that she has been smoking. She has never used smokeless tobacco. She reports that she does not drink alcohol or use drugs.  Allergies:    No Known Allergies  Family History :   No family history on file. Family history reviewed, noted as above, not pertinent to current presentation.   Home Medications:    Prior to Admission medications   Medication Sig Start Date End Date Taking? Authorizing Provider  aspirin 81 MG EC tablet Take 81 mg by mouth daily.  06/16/19 06/15/20 Yes [provider]  augmented betamethasone dipropionate (DIPROLENE-AF) 0.05 % cream Apply 1 application topically 2 (two) times daily. Applied to hands (Not to be applied to the face, groin, or underarm areas) 02/11/19  Yes [provider]  clopidogrel (PLAVIX) 75 MG tablet Take 75 mg by mouth daily. 06/24/19  Yes [provider]  SANTYL ointment Apply 1 application topically See admin instructions. Apply to wound of leg leg with each  dressing change as directed 05/13/19  Yes [provider]  simvastatin (ZOCOR) 20 MG tablet Take 20 mg by mouth at bedtime. 06/16/19  Yes [provider]  triamcinolone cream (KENALOG) 0.1 % Apply 1 application topically 2 (two) times daily.  10/17/18  Yes [provider]    Physical Exam:    Physical Exam: Vitals:   06/27/19 1546 06/27/19 1830 06/27/19 1900 06/27/19 1930  BP:  (!) 146/59 (!) 153/57 (!) 157/93  Pulse:  95    Resp: (!) 22 13 (!) 28 15  Temp:   98.9 F (37.2 C)   TempSrc:   Oral   SpO2:  96% 97%   Weight:      Height:        Constitutional: NAD, calm, comfortable Vitals:   06/27/19 1546 06/27/19 1830 06/27/19 1900 06/27/19 1930  BP:  (!) 146/59 (!) 153/57 (!) 157/93  Pulse:  95    Resp: (!) 22 13 (!) 28 15  Temp:   98.9 F (37.2 C)   TempSrc:   Oral   SpO2:  96% 97%   Weight:      Height:       Eyes: PERRL, lids and conjunctivae normal ENMT: Mucous membranes are dry, posterior pharynx clear of any exudate or lesions.Normal dentition.  Neck: normal, supple, no masses, no thyromegaly Respiratory: clear to auscultation bilaterally, no wheezing, no crackles. Normal respiratory effort. No accessory muscle use.  Cardiovascular: Regular rate  and rhythm, no murmurs / rubs / gallops. No extremity edema. 2+ pedal pulses. No carotid bruits.  Abdomen: no tenderness, no masses palpated. No hepatosplenomegaly. Bowel sounds positive.  Musculoskeletal: no clubbing / cyanosis. No joint deformity upper and lower extremities. Good ROM, no contractures. Normal muscle tone.  Skin: no rashes, lesions, ulcers. No induration Neurologic: No obvious focal deficits except for decreased visual acuity in the right eye, sensations grossly intact, power was 5 x 5 in all 4 extremities, finger-to-nose test was normal. Psychiatric: Patient appeared confused, judgment and thought content was impaired, unable to tell me the hospital name, unable to tell me the year or  the month.   Decubitus Ulcers: Not present on admission Catheters and tubes: None  Data Review:    Labs on Admission: I have personally reviewed following labs and imaging studies  CBC: Recent Labs  Lab 06/27/19 1802  WBC 17.1*  NEUTROABS 15.3*  HGB 12.6  HCT 39.8  MCV 86.1  PLT 264   Basic Metabolic Panel: Recent Labs  Lab 06/27/19 1802  NA 133*  K 3.3*  CL 97*  CO2 22  GLUCOSE 117*  BUN 12  CREATININE 0.88  CALCIUM 8.3*   GFR: Estimated Creatinine Clearance: 75.6 mL/min (by C-G formula based on SCr of 0.88 mg/dL). Liver Function Tests: Recent Labs  Lab 06/27/19 1802  AST 18  ALT 13  ALKPHOS 82  BILITOT 1.3*  PROT 7.9  ALBUMIN 3.5   No results for input(s): LIPASE, AMYLASE in the last 168 hours. No results for input(s): AMMONIA in the last 168 hours. Coagulation Profile: Recent Labs  Lab 06/27/19 1802  INR 1.2   Cardiac Enzymes: No results for input(s): CKTOTAL, CKMB, CKMBINDEX, TROPONINI in the last 168 hours. BNP (last 3 results) No results for input(s): PROBNP in the last 8760 hours. HbA1C: No results for input(s): HGBA1C in the last 72 hours. CBG: No results for input(s): GLUCAP in the last 168 hours. Lipid Profile: No results for input(s): CHOL, HDL, LDLCALC, TRIG, CHOLHDL, LDLDIRECT in the last 72 hours. Thyroid Function Tests: No results for input(s): TSH, T4TOTAL, FREET4, T3FREE, THYROIDAB in the last 72 hours. Anemia Panel: No results for input(s): VITAMINB12, FOLATE, FERRITIN, TIBC, IRON, RETICCTPCT in the last 72 hours. Urine analysis:    Component Value Date/Time   COLORURINE YELLOW 06/27/2019 1733   APPEARANCEUR HAZY (A) 06/27/2019 1733   LABSPEC 1.016 06/27/2019 1733   PHURINE 5.0 06/27/2019 1733   GLUCOSEU NEGATIVE 06/27/2019 1733   HGBUR LARGE (A) 06/27/2019 1733   BILIRUBINUR NEGATIVE 06/27/2019 1733   KETONESUR 5 (A) 06/27/2019 1733   PROTEINUR 30 (A) 06/27/2019 1733   NITRITE NEGATIVE 06/27/2019 1733   LEUKOCYTESUR  NEGATIVE 06/27/2019 1733     Imaging Results:      Radiological Exams on Admission: CT Head Wo Contrast  Result Date: 06/27/2019 CLINICAL DATA:  Stroke-like symptoms EXAM: CT HEAD WITHOUT CONTRAST TECHNIQUE: Contiguous axial images were obtained from the base of the skull through the vertex without intravenous contrast. COMPARISON:  None. FINDINGS: Brain: No evidence of acute infarction, hemorrhage, hydrocephalus, extra-axial collection or mass lesion/mass effect. Vascular: No hyperdense vessel or unexpected calcification. Skull: Normal. Negative for fracture or focal lesion. Sinuses/Orbits: No acute finding. Other: None. IMPRESSION: No acute intracranial abnormality noted. Electronically Signed   By: Alcide Clever M.D.   On: 06/27/2019 17:12   DG Chest Port 1 View  Result Date: 06/27/2019 CLINICAL DATA:  Fever.  Left leg wound. EXAM: PORTABLE CHEST 1 VIEW  COMPARISON:  05/06/2017, report only FINDINGS: AP portable chest film. The costophrenic angles and inferior-most chest are excluded. Midline trachea. Mild cardiomegaly. Tortuous thoracic aorta. No large pleural fluid. No pneumothorax. Basilar predominant areas of linear interstitial thickening and scarring. No lobar consolidation. IMPRESSION: No acute cardiopulmonary disease. Cardiomegaly without congestive failure. Electronically Signed   By: Abigail Miyamoto M.D.   On: 06/27/2019 16:57      Stockton Nunley Ginette Otto MD [facility]  If 7PM-7AM, please contact night-coverage   06/27/2019, 8:35 PM

## 2019-06-27 NOTE — ED Provider Notes (Signed)
[facility] EMERGENCY DEPARTMENT Provider Note   CSN: [id number] Arrival date & time: 06/27/19  1530     History Chief Complaint  Patient presents with  . Altered Mental Status    Lauren Brown is a 59 y.o. female.  HPI     59 year old with peripheral vascular disease status post stenting at [place] in her right iliac artery 2 days ago comes into the ER with chief complaint of altered mental status.  Level 5 caveat for altered mental status.  Patient oriented to self.  She thinks that she is at another hospital.  She has no complaints from her side but does indicate that she slept all day yesterday, and does not feel her usual self.  She denies any numbness, tingling, vision change, dizziness, focal weakness.  Patient also denies any nausea, vomiting, fevers, chills, headache, neck pain.  She has ulcer in her leg, but has no new pain.  I discussed the case with family.  They informed me that patient slept all day yesterday but this morning she was confused.  She was babbling incoherently and had slurred speech.  They checked her temperature and she had a temp of 99.8.  There is no history of stroke.  Past Medical History:  Diagnosis Date  . Eczema     There are no problems to display for this patient.   Past Surgical History:  Procedure Laterality Date  . BUNIONECTOMY    . TONSILLECTOMY       OB History   No obstetric history on file.     No family history on file.  Social History   Tobacco Use  . Smoking status: Current Every Day Smoker  . Smokeless tobacco: Never Used  Substance Use Topics  . Alcohol use: Never  . Drug use: Never    Home Medications Prior to Admission medications   Medication Sig Start Date End Date Taking? Authorizing Provider  augmented betamethasone dipropionate (DIPROLENE-AF) 0.05 % cream APPLY CREAM TOPICALLY TO HANDS TWICE DAILY AS NEEDED FOR ECZEMA NOT TO FACE GROIN OR UNDERARMS 02/11/19   [provider]    triamcinolone cream (KENALOG) 0.1 % APPLY CREAM EXTERNALLY TWICE DAILY 10/17/18   [provider]    Allergies    Patient has no known allergies.  Review of Systems   Review of Systems  Unable to perform ROS: Mental status change  Constitutional: Positive for activity change.  Hematological: Does not bruise/bleed easily.    Physical Exam Updated Vital Signs BP (!) 157/72 (BP Location: Right Arm)   Pulse 91   Temp 100.2 F (37.9 C) (Oral)   Resp (!) 22   Ht 5\' 7"  (1.702 m)   Wt 79.4 kg   SpO2 97%   BMI 27.41 kg/m   Physical Exam Vitals and nursing note reviewed.  Constitutional:      Appearance: She is well-developed.  HENT:     Head: Normocephalic and atraumatic.  Eyes:     Extraocular Movements: Extraocular movements intact.     Pupils: Pupils are equal, round, and reactive to light.  Cardiovascular:     Rate and Rhythm: Normal rate.  Pulmonary:     Effort: Pulmonary effort is normal.  Abdominal:     General: Bowel sounds are normal.  Musculoskeletal:     Cervical back: Normal range of motion and neck supple.     Comments: 6 cm deep ulcer in the right mid thigh anteriorly.  Patient has 2+ dorsalis pedis.  Skin:  General: Skin is warm and dry.  Neurological:     Mental Status: She is alert. She is disoriented.     Cranial Nerves: No cranial nerve deficit.     Sensory: No sensory deficit.     Motor: No weakness.     ED Results / Procedures / Treatments   Labs (all labs ordered are listed, but only abnormal results are displayed) Labs Reviewed  CULTURE, BLOOD (ROUTINE X 2)  CULTURE, BLOOD (ROUTINE X 2)  URINE CULTURE  LACTIC ACID, PLASMA  LACTIC ACID, PLASMA  COMPREHENSIVE METABOLIC PANEL  CBC WITH DIFFERENTIAL/PLATELET  APTT  PROTIME-INR  URINALYSIS, ROUTINE W REFLEX MICROSCOPIC  PROCALCITONIN  D-DIMER, QUANTITATIVE (NOT AT [facility])  POC URINE PREG, ED    EKG EKG Interpretation  Date/Time:  Saturday June 27 2019 17:18:25  EST Ventricular Rate:  93 PR Interval:    QRS Duration: 102 QT Interval:  369 QTC Calculation: 459 R Axis:   100 Text Interpretation: Sinus rhythm Right atrial enlargement Right axis deviation Borderline T wave abnormalities Borderline ST elevation, lateral leads Interpretation limited secondary to artifact Confirmed by Derwood Kaplan ([id number]) on 06/27/2019 5:27:27 PM   Radiology DG Chest Port 1 View  Result Date: 06/27/2019 CLINICAL DATA:  Fever.  Left leg wound. EXAM: PORTABLE CHEST 1 VIEW COMPARISON:  05/06/2017, report only FINDINGS: AP portable chest film. The costophrenic angles and inferior-most chest are excluded. Midline trachea. Mild cardiomegaly. Tortuous thoracic aorta. No large pleural fluid. No pneumothorax. Basilar predominant areas of linear interstitial thickening and scarring. No lobar consolidation. IMPRESSION: No acute cardiopulmonary disease. Cardiomegaly without congestive failure. Electronically Signed   By: Jeronimo Greaves M.D.   On: 06/27/2019 16:57    Procedures Procedures (including critical care time)  Medications Ordered in ED Medications  cefTRIAXone (ROCEPHIN) 2 g in sodium chloride 0.9 % 100 mL IVPB (has no administration in time range)    ED Course  I have reviewed the triage vital signs and the nursing notes.  Pertinent labs & imaging results that were available during my care of the patient were reviewed by me and considered in my medical decision making (see chart for details).    MDM Rules/Calculators/A&P                      59 year old female comes in a chief complaint of confusion.  She has known history of peripheral vascular disease and is status post stenting from 3 days ago in her right common iliac artery.  She is also noted to have right-sided ulcer with some granulation tissue.  DDx includes: ICH / Stroke Sepsis syndrome Infection - UTI/Pneumonia/cellulitis/osteomyelitis Encephalopathy -toxin mediated Electrolyte abnormality Cancer of  unknown origin / paraneoplastic process  She has a low-grade fever right now.  The wound does not particularly look infected.  Osteomyelitis is possible.  After speaking with the family however it seems like she has more of disorientation, slurred speech.  If the labs are reassuring then we will also consult teleneuro -given the recent procedure she is at risk for possible embolic stroke.  Final Clinical Impression(s) / ED Diagnoses Final diagnoses:  Disorientation    Rx / DC Orders ED Discharge Orders    None       Derwood Kaplan, MD 06/27/19 2321

## 2019-06-27 NOTE — ED Triage Notes (Signed)
Pt with recent admission to [facility] around 12/30 for left leg wound, was found to have a clot in Left illiac artery that was stented during admisstion.  Was normal per family yesterday and was active. Reports woke up this morning with AMS, fever.

## 2019-06-27 NOTE — Progress Notes (Signed)
Pharmacy Antibiotic Note  Lauren Brown is a 59 y.o. female admitted on 06/27/2019 with wound infection.  Pharmacy has been consulted for vancomycin dosing.  Plan: Vancomycin 1.5gm iv x 1, vancomycin 1gm iv q12h   Height: 5\' 7"  (170.2 cm) Weight: 175 lb (79.4 kg) IBW/kg (Calculated) : 61.6  Temp (24hrs), Avg:99.6 F (37.6 C), Min:98.9 F (37.2 C), Max:100.2 F (37.9 C)  Recent Labs  Lab 06/27/19 1802  WBC 17.1*  CREATININE 0.88  LATICACIDVEN 1.8    Estimated Creatinine Clearance: 75.6 mL/min (by C-G formula based on SCr of 0.88 mg/dL).    No Known Allergies  Antimicrobials this admission: 1/2 vancomycin >>  Microbiology results: 1/2 BCx: sent  1/2 UCx: sent   Thank you for allowing pharmacy to be a part of this patient's care.  08/25/19 Lauren Brown 06/27/2019 8:40 PM

## 2019-06-28 ENCOUNTER — Inpatient Hospital Stay (HOSPITAL_COMMUNITY): Payer: BC Managed Care – PPO

## 2019-06-28 DIAGNOSIS — R509 Fever, unspecified: Secondary | ICD-10-CM

## 2019-06-28 LAB — BASIC METABOLIC PANEL
Anion gap: 10 (ref 5–15)
BUN: 15 mg/dL (ref 6–20)
CO2: 22 mmol/L (ref 22–32)
Calcium: 8.2 mg/dL — ABNORMAL LOW (ref 8.9–10.3)
Chloride: 98 mmol/L (ref 98–111)
Creatinine, Ser: 0.84 mg/dL (ref 0.44–1.00)
GFR calc Af Amer: >60 mL/min (ref >60)
GFR calc non Af Amer: >60 mL/min (ref >60)
Glucose, Bld: 233 mg/dL — ABNORMAL HIGH (ref 70–99)
Potassium: 3.2 mmol/L — ABNORMAL LOW (ref 3.5–5.1)
Sodium: 130 mmol/L — ABNORMAL LOW (ref 135–145)

## 2019-06-28 LAB — CBC
HCT: 37.9 % (ref 36.0–46.0)
Hemoglobin: 12.1 g/dL (ref 12.0–15.0)
MCH: 26.8 pg (ref 26.0–34.0)
MCHC: 31.9 g/dL (ref 30.0–36.0)
MCV: 84 fL (ref 80.0–100.0)
Platelets: 202 10*3/uL (ref 150–400)
RBC: 4.51 MIL/uL (ref 3.87–5.11)
RDW: 13 % (ref 11.5–15.5)
WBC: 11.8 10*3/uL — ABNORMAL HIGH (ref 4.0–10.5)
nRBC: 0 % (ref 0.0–0.2)

## 2019-06-28 LAB — HEPATIC FUNCTION PANEL
ALT: 20 U/L (ref 0–44)
AST: 32 U/L (ref 15–41)
Albumin: 3.1 g/dL — ABNORMAL LOW (ref 3.5–5.0)
Alkaline Phosphatase: 71 U/L (ref 38–126)
Bilirubin, Direct: 0.2 mg/dL (ref 0.0–0.2)
Indirect Bilirubin: 0.7 mg/dL (ref 0.3–0.9)
Total Bilirubin: 0.9 mg/dL (ref 0.3–1.2)
Total Protein: 7 g/dL (ref 6.5–8.1)

## 2019-06-28 LAB — ECHOCARDIOGRAM COMPLETE
Height: 67 in
Weight: 2800 oz

## 2019-06-28 LAB — TSH: TSH: 0.335 u[IU]/mL — ABNORMAL LOW (ref 0.350–4.500)

## 2019-06-28 LAB — VITAMIN B12: Vitamin B-12: 212 pg/mL (ref 180–914)

## 2019-06-28 LAB — AMMONIA: Ammonia: 27 umol/L (ref 9–35)

## 2019-06-28 LAB — SARS CORONAVIRUS 2 (TAT 6-24 HRS): SARS Coronavirus 2: NEGATIVE

## 2019-06-28 LAB — HIV ANTIBODY (ROUTINE TESTING W REFLEX): HIV Screen 4th Generation wRfx: NONREACTIVE

## 2019-06-28 MED ORDER — SODIUM CHLORIDE 0.9 % IV SOLN
2.0000 g | INTRAVENOUS | Status: DC
  Administered 2019-06-28 – 2019-06-29 (×7): 2 g via INTRAVENOUS
  Filled 2019-06-28 (×2): qty 2
  Filled 2019-06-28: qty 2000
  Filled 2019-06-28: qty 2
  Filled 2019-06-28 (×2): qty 2000
  Filled 2019-06-28: qty 2
  Filled 2019-06-28 (×7): qty 2000

## 2019-06-28 MED ORDER — DEXTROSE 5 % IV SOLN
800.0000 mg | Freq: Three times a day (TID) | INTRAVENOUS | Status: DC
  Administered 2019-06-28 – 2019-06-29 (×4): 800 mg via INTRAVENOUS
  Filled 2019-06-28 (×8): qty 16

## 2019-06-28 MED ORDER — POTASSIUM CHLORIDE CRYS ER 20 MEQ PO TBCR
40.0000 meq | EXTENDED_RELEASE_TABLET | Freq: Once | ORAL | Status: AC
  Administered 2019-06-28: 40 meq via ORAL
  Filled 2019-06-28: qty 2

## 2019-06-28 NOTE — Progress Notes (Addendum)
Pharmacy Antibiotic Note  Lauren Brown is a 59 y.o. 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  Pharmacy has been consulted for acyclovir and vancomycin dosing.  Plan: Vancomycin 1000mg  IV every 12 hours.  Goal trough 15-20 mcg/mL. acyclovir 800mg  iv q8h   Height: 5\' 7"  (170.2 cm) Weight: 175 lb (79.4 kg) IBW/kg (Calculated) : 61.6  Temp (24hrs), Avg:99.6 F (37.6 C), Min:98.9 F (37.2 C), Max:100.2 F (37.9 C)  Recent Labs  Lab 06/27/19 1802  WBC 17.1*  CREATININE 0.88  LATICACIDVEN 1.8    Estimated Creatinine Clearance: 75.6 mL/min (by C-G formula based on SCr of 0.88 mg/dL).    No Known Allergies  Antimicrobials this admission: 1/2 ceftriaxone x 1  1/3 acyclovir >>  1/2 vancomycin>> 1/3 ampicillin >>   Microbiology results: 1/2 BCx: pending, gram positive cocci in aerobic bottle 1/2 Wound from leg: sent  1/2 Covid 19: sent    Thank you for allowing pharmacy to be a part of this patient's care.  Daleigh Pollinger 06/28/2019 8:22 AM

## 2019-06-28 NOTE — ED Notes (Signed)
Date and time results received: 06/28/19 GRAM POSITIVE COCCI GRAM STAIN  (use smartphrase ".now" to insert current time)  Test:blood culture Critical Value: gram positive cocci

## 2019-06-28 NOTE — Progress Notes (Signed)
  Echocardiogram 2D Echocardiogram has been performed.  Celene Skeen 06/28/2019, 10:23 AM

## 2019-06-28 NOTE — Progress Notes (Signed)
PROGRESS NOTE    Lauren Brown  RJJ:[id number] DOB: [birth year] DOA: 06/27/2019 PCP: Patient, No Pcp Per   Brief Narrative:  Per HPI: Lauren Brown is a 59 y.o. female with medical history significant of peripheral vascular disease, hypertension, hyperlipidemia who presented to the ER with altered mental status.  Patient has a history of peripheral vascular disease and had a left iliac artery stent placement on 06/24/2019 at [facility].  At baseline she is usually alert and oriented and has been having worsening mental status over the last 2 days.  History is being obtained mainly from the ER record and the family as the patient is unable to provide details.  She was apparently noted to be sleeping all day yesterday and did not feel like her usual self.  This morning she was noted to be confused and was babbling incoherently and had slurring of speech.  Was noted to have a low-grade temperature on arrival.  No nausea, vomiting, abdominal pain, diarrhea, dysuria, hematemesis, melena, hematochezia, syncope, seizures, dizziness, lightheadedness was reported.  No falls, trauma, head injury, neck pain was reported.  No tingling, numbness, vision change, focal weakness.  1/3: Patient appears to be alert and oriented without any photophobia or nuchal rigidity.  She suspected to have a potential CNS infection in light of recent left iliac artery stent placement on 06/24/2019.  CT of the head with no acute findings.  Will need transfer to [facility] for brain MRI and further evaluation per ID.  She has been started on vancomycin and Rocephin and I have added ampicillin and acyclovir per neurology recommendations.  TSH, B12, and ammonia levels pending.  Covid testing pending.  Assessment & Plan:   Principal Problem:   AMS (altered mental status) Active Problems:   PVD (peripheral vascular disease) (HCC)   Essential hypertension   Hyperlipidemia   Altered mental status   Acute encephalopathy-likely  metabolic versus CNS infection -CT head negative for any acute findings, but will require brain MRI for further evaluation and thus need for transfer to [facility] -Noted to have low-grade temperature and leukocytosis on admission, but no photophobia or nuchal rigidity appreciated on my exam -Discussed case with Dr. Linus Salmons of ID who will see patient in consultation -LP could not be performed due to severe agitation in ED and has been ordered to be done at [facility] -Continue empiric vancomycin, ceftriaxone, ampicillin, and acyclovir -Follow-up B12, TSH, and ammonia levels  Sepsis-multifactorial with possible osteomyelitis and/or CNS infection -Continue current antibiotics as noted above -Patient noted to have gram-positive cocci in 1 set of blood culture thus far -Discussed with ID who will see patient in consultation -Plan for brain MRI as well as left lower extremity MRI for CNS infection and osteomyelitis evaluation respectively  Peripheral vascular disease -Patient had left lower extremity limb threatening ischemia with left common iliac artery stent placement on 06/24/2019 at [place] noted bilaterally and will plan to hold aspirin and Plavix for now with potential LP soon.  Resume thereafter.  Dyslipidemia -Continue simvastatin  DVT prophylaxis: Lovenox Code Status: Full Family Communication: None at bedside Disposition Plan: Transfer to [facility] for brain and lower extremity MRI as well as ID evaluation.   Consultants:   ID, discussed with Dr. Linus Salmons  Procedures:   None  Antimicrobials:  Anti-infectives (From admission, onward)   Start     Dose/Rate Route Frequency Ordered Stop   06/28/19 1800  cefTRIAXone (ROCEPHIN) 2 g in sodium chloride 0.9 % 100 mL IVPB  2 g 200 mL/hr over 30 Minutes Intravenous Every 24 hours 06/27/19 2134     06/28/19 0900  vancomycin (VANCOCIN) IVPB 1000 mg/200 mL premix     1,000 mg 200 mL/hr over 60 Minutes Intravenous Every 12 hours  06/27/19 2040     06/28/19 0815  acyclovir (ZOVIRAX) 800 mg in dextrose 5 % 150 mL IVPB     800 mg 166 mL/hr over 60 Minutes Intravenous Every 8 hours 06/28/19 0805     06/28/19 0800  ampicillin (OMNIPEN) 2 g in sodium chloride 0.9 % 100 mL IVPB     2 g 300 mL/hr over 20 Minutes Intravenous Every 4 hours 06/28/19 0745     06/27/19 2045  vancomycin (VANCOREADY) IVPB 1500 mg/300 mL     1,500 mg 150 mL/hr over 120 Minutes Intravenous  Once 06/27/19 2038 06/27/19 2350   06/27/19 1715  cefTRIAXone (ROCEPHIN) 2 g in sodium chloride 0.9 % 100 mL IVPB     2 g 200 mL/hr over 30 Minutes Intravenous  Once 06/27/19 1705 06/27/19 1900       Subjective: Patient seen and evaluated today with no new acute complaints or concerns. No acute concerns or events noted overnight.  She states that she is feeling quite thirsty and would like something to drink.  She denies any photophobia and does not appear confused.  Objective: Vitals:   06/28/19 0630 06/28/19 0700 06/28/19 0730 06/28/19 0800  BP: 122/70 129/69 137/73 130/66  Pulse: 88 86 92 86  Resp: (!) 22 (!) 23 14 17   Temp:      TempSrc:      SpO2: 95% 96% 98% 96%  Weight:      Height:       No intake or output data in the 24 hours ending 06/28/19 0843 Filed Weights   06/27/19 1540  Weight: 79.4 kg    Examination:  General exam: Appears calm and comfortable  Respiratory system: Clear to auscultation. Respiratory effort normal.  Currently on room air. Cardiovascular system: S1 & S2 heard, RRR. No JVD, murmurs, rubs, gallops or clicks. No pedal edema. Gastrointestinal system: Abdomen is nondistended, soft and nontender. No organomegaly or masses felt. Normal bowel sounds heard. Central nervous system: Alert and oriented. No focal neurological deficits. Extremities: Symmetric 5 x 5 power. Skin: No rashes, lesions or ulcers Psychiatry: Judgement and insight appear normal. Mood & affect appropriate.     Data Reviewed: I have personally  reviewed following labs and imaging studies  CBC: Recent Labs  Lab 06/27/19 1802  WBC 17.1*  NEUTROABS 15.3*  HGB 12.6  HCT 39.8  MCV 86.1  PLT 264   Basic Metabolic Panel: Recent Labs  Lab 06/27/19 1802  NA 133*  K 3.3*  CL 97*  CO2 22  GLUCOSE 117*  BUN 12  CREATININE 0.88  CALCIUM 8.3*   GFR: Estimated Creatinine Clearance: 75.6 mL/min (by C-G formula based on SCr of 0.88 mg/dL). Liver Function Tests: Recent Labs  Lab 06/27/19 1802  AST 18  ALT 13  ALKPHOS 82  BILITOT 1.3*  PROT 7.9  ALBUMIN 3.5   No results for input(s): LIPASE, AMYLASE in the last 168 hours. No results for input(s): AMMONIA in the last 168 hours. Coagulation Profile: Recent Labs  Lab 06/27/19 1802  INR 1.2   Cardiac Enzymes: No results for input(s): CKTOTAL, CKMB, CKMBINDEX, TROPONINI in the last 168 hours. BNP (last 3 results) No results for input(s): PROBNP in the last 8760 hours. HbA1C: No results  for input(s): HGBA1C in the last 72 hours. CBG: No results for input(s): GLUCAP in the last 168 hours. Lipid Profile: No results for input(s): CHOL, HDL, LDLCALC, TRIG, CHOLHDL, LDLDIRECT in the last 72 hours. Thyroid Function Tests: No results for input(s): TSH, T4TOTAL, FREET4, T3FREE, THYROIDAB in the last 72 hours. Anemia Panel: No results for input(s): VITAMINB12, FOLATE, FERRITIN, TIBC, IRON, RETICCTPCT in the last 72 hours. Sepsis Labs: Recent Labs  Lab 06/27/19 1802  PROCALCITON 2.50  LATICACIDVEN 1.8    Recent Results (from the past 240 hour(s))  Blood Culture (routine x 2)     Status: None (Preliminary result)   Collection Time: 06/27/19  6:02 PM   Specimen: Right Antecubital; Blood  Result Value Ref Range Status   Specimen Description RIGHT ANTECUBITAL  Final   Special Requests   Final    BOTTLES DRAWN AEROBIC AND ANAEROBIC Blood Culture adequate volume   Culture  Setup Time   Final    GRAM POSITIVE COCCI AEROBIC BOTTLE ONLY Gram Stain Report Called to,Read  Back By and Verified With: RACHAEL H.,RN @0742  06/28/2019 KAY Performed at [facility], [address], [place], [place]    Culture PENDING  Incomplete   Report Status PENDING  Incomplete         Radiology Studies: CT Head Wo Contrast  Result Date: 06/27/2019 CLINICAL DATA:  Stroke-like symptoms EXAM: CT HEAD WITHOUT CONTRAST TECHNIQUE: Contiguous axial images were obtained from the base of the skull through the vertex without intravenous contrast. COMPARISON:  None. FINDINGS: Brain: No evidence of acute infarction, hemorrhage, hydrocephalus, extra-axial collection or mass lesion/mass effect. Vascular: No hyperdense vessel or unexpected calcification. Skull: Normal. Negative for fracture or focal lesion. Sinuses/Orbits: No acute finding. Other: None. IMPRESSION: No acute intracranial abnormality noted. Electronically Signed   By: 08/25/2019 M.D.   On: 06/27/2019 17:12   DG Chest Port 1 View  Result Date: 06/27/2019 CLINICAL DATA:  Fever.  Left leg wound. EXAM: PORTABLE CHEST 1 VIEW COMPARISON:  05/06/2017, report only FINDINGS: AP portable chest film. The costophrenic angles and inferior-most chest are excluded. Midline trachea. Mild cardiomegaly. Tortuous thoracic aorta. No large pleural fluid. No pneumothorax. Basilar predominant areas of linear interstitial thickening and scarring. No lobar consolidation. IMPRESSION: No acute cardiopulmonary disease. Cardiomegaly without congestive failure. Electronically Signed   By: 13/05/2017 M.D.   On: 06/27/2019 16:57        Scheduled Meds: . aspirin EC  81 mg Oral Daily  . clopidogrel  75 mg Oral Daily  . collagenase  1 application Topical See admin instructions  . enoxaparin (LOVENOX) injection  40 mg Subcutaneous Q24H  . potassium chloride  40 mEq Oral Once  . simvastatin  20 mg Oral QHS  . sodium chloride flush  3 mL Intravenous Q12H   Continuous Infusions: . sodium chloride    . acyclovir    . ampicillin (OMNIPEN) IV      . cefTRIAXone (ROCEPHIN)  IV    . lactated ringers Stopped (06/27/19 2144)  . vancomycin       LOS: 1 day    Time spent: 30 minutes    Usha Slager 08/25/19, DO [facility] Pager [phone]  If 7PM-7AM, please contact night-coverage [email or web address] Password [facility] 06/28/2019, 8:43 AM

## 2019-06-28 NOTE — ED Notes (Signed)
[facility] updated with room number.

## 2019-06-29 ENCOUNTER — Encounter (HOSPITAL_COMMUNITY): Payer: Self-pay | Admitting: Internal Medicine

## 2019-06-29 ENCOUNTER — Inpatient Hospital Stay (HOSPITAL_COMMUNITY): Payer: BC Managed Care – PPO

## 2019-06-29 DIAGNOSIS — L089 Local infection of the skin and subcutaneous tissue, unspecified: Secondary | ICD-10-CM

## 2019-06-29 DIAGNOSIS — R7881 Bacteremia: Secondary | ICD-10-CM | POA: Diagnosis present

## 2019-06-29 DIAGNOSIS — I6522 Occlusion and stenosis of left carotid artery: Secondary | ICD-10-CM

## 2019-06-29 DIAGNOSIS — R739 Hyperglycemia, unspecified: Secondary | ICD-10-CM

## 2019-06-29 DIAGNOSIS — I6330 Cerebral infarction due to thrombosis of unspecified cerebral artery: Secondary | ICD-10-CM | POA: Insufficient documentation

## 2019-06-29 DIAGNOSIS — B9561 Methicillin susceptible Staphylococcus aureus infection as the cause of diseases classified elsewhere: Secondary | ICD-10-CM | POA: Diagnosis present

## 2019-06-29 DIAGNOSIS — R4182 Altered mental status, unspecified: Secondary | ICD-10-CM

## 2019-06-29 DIAGNOSIS — R509 Fever, unspecified: Secondary | ICD-10-CM

## 2019-06-29 DIAGNOSIS — X58XXXA Exposure to other specified factors, initial encounter: Secondary | ICD-10-CM

## 2019-06-29 DIAGNOSIS — F17200 Nicotine dependence, unspecified, uncomplicated: Secondary | ICD-10-CM

## 2019-06-29 DIAGNOSIS — I739 Peripheral vascular disease, unspecified: Secondary | ICD-10-CM

## 2019-06-29 DIAGNOSIS — Z95820 Peripheral vascular angioplasty status with implants and grafts: Secondary | ICD-10-CM

## 2019-06-29 DIAGNOSIS — S81802A Unspecified open wound, left lower leg, initial encounter: Secondary | ICD-10-CM | POA: Diagnosis present

## 2019-06-29 DIAGNOSIS — G9341 Metabolic encephalopathy: Secondary | ICD-10-CM

## 2019-06-29 LAB — CBC
HCT: 36.5 % (ref 36.0–46.0)
Hemoglobin: 11.9 g/dL — ABNORMAL LOW (ref 12.0–15.0)
MCH: 26.6 pg (ref 26.0–34.0)
MCHC: 32.6 g/dL (ref 30.0–36.0)
MCV: 81.7 fL (ref 80.0–100.0)
Platelets: 202 10*3/uL (ref 150–400)
RBC: 4.47 MIL/uL (ref 3.87–5.11)
RDW: 12.7 % (ref 11.5–15.5)
WBC: 7.8 10*3/uL (ref 4.0–10.5)
nRBC: 0 % (ref 0.0–0.2)

## 2019-06-29 LAB — COMPREHENSIVE METABOLIC PANEL
ALT: 21 U/L (ref 0–44)
AST: 33 U/L (ref 15–41)
Albumin: 2.6 g/dL — ABNORMAL LOW (ref 3.5–5.0)
Alkaline Phosphatase: 66 U/L (ref 38–126)
Anion gap: 9 (ref 5–15)
BUN: 7 mg/dL (ref 6–20)
CO2: 26 mmol/L (ref 22–32)
Calcium: 8.2 mg/dL — ABNORMAL LOW (ref 8.9–10.3)
Chloride: 99 mmol/L (ref 98–111)
Creatinine, Ser: 0.72 mg/dL (ref 0.44–1.00)
GFR calc Af Amer: >60 mL/min (ref >60)
GFR calc non Af Amer: >60 mL/min (ref >60)
Glucose, Bld: 115 mg/dL — ABNORMAL HIGH (ref 70–99)
Potassium: 3.6 mmol/L (ref 3.5–5.1)
Sodium: 134 mmol/L — ABNORMAL LOW (ref 135–145)
Total Bilirubin: 0.5 mg/dL (ref 0.3–1.2)
Total Protein: 6.1 g/dL — ABNORMAL LOW (ref 6.5–8.1)

## 2019-06-29 IMAGING — MR MR MRA HEAD W/O CM
1 series · 19 of 48 positions shown · non-contrast
Comparison: None.

CLINICAL DATA: Altered mental status. Cytotoxic lesion in the
splenium of the corpus callosum on MRI.

EXAM:
MRA HEAD WITHOUT CONTRAST
TECHNIQUE: Angiographic images of the Circle of Willis were obtained using MRA
technique without intravenous contrast.

[Series 5: 3d cow · axial · 0.5mm · 0.43mm/px · z∈[-98,-16]mm · 19 of 172 slices shown]
[im 1/172]
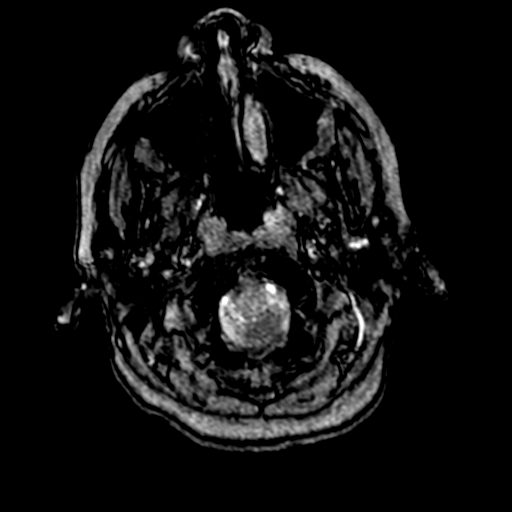
[im 4/172]
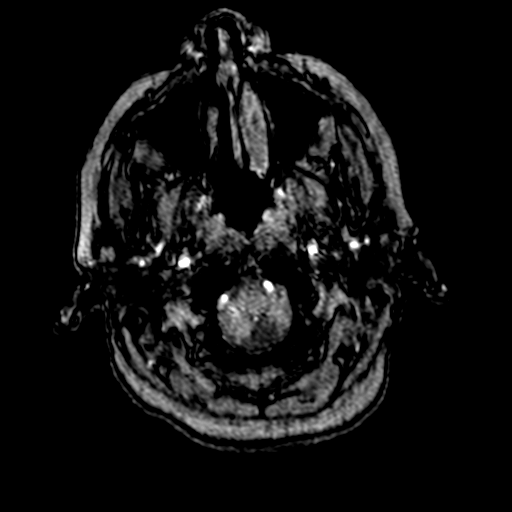
[im 8/172]
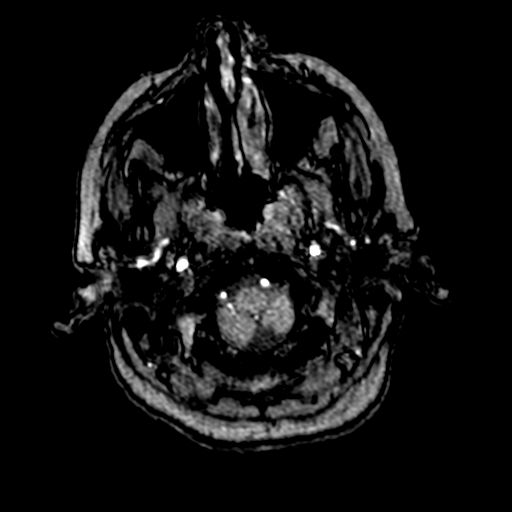
[im 11/172]
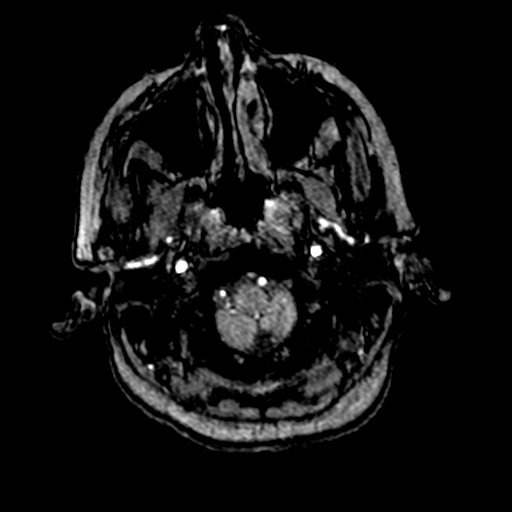
[im 15/172]
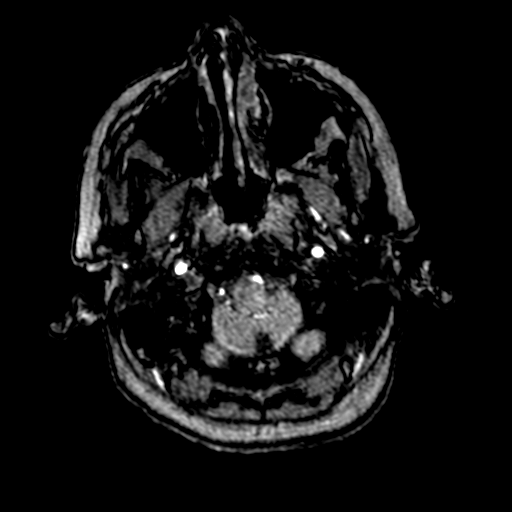
[im 19/172]
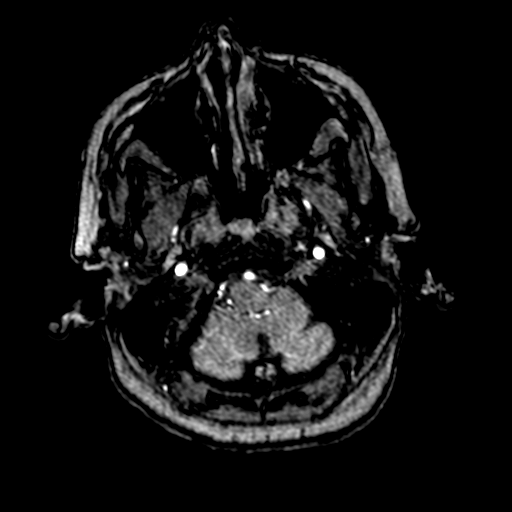
[im 22/172]
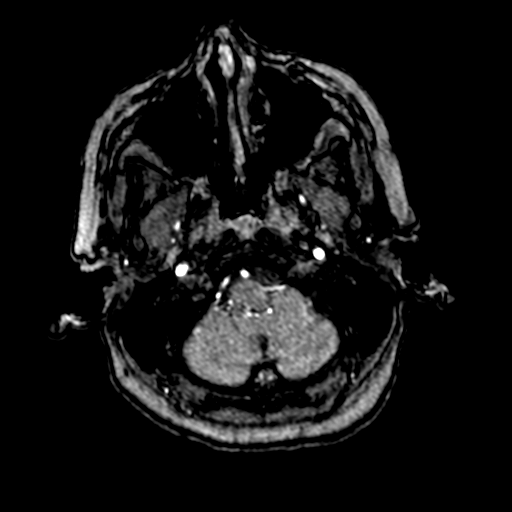
[im 26/172]
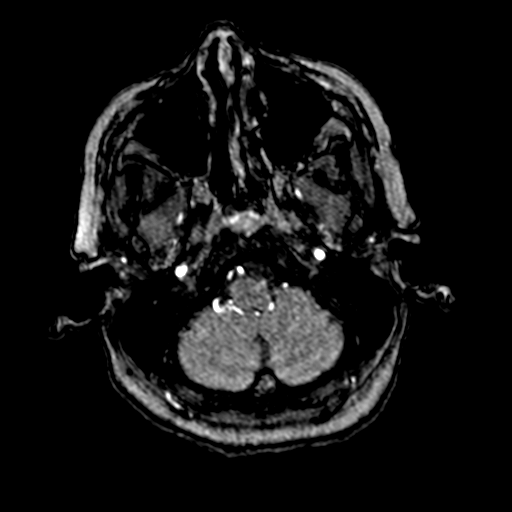
[im 30/172]
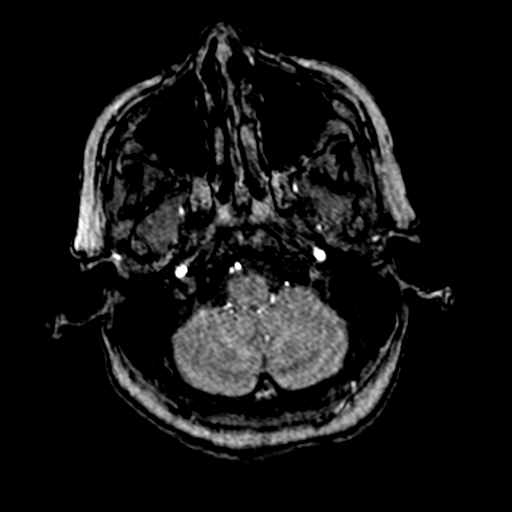
[im 33/172]
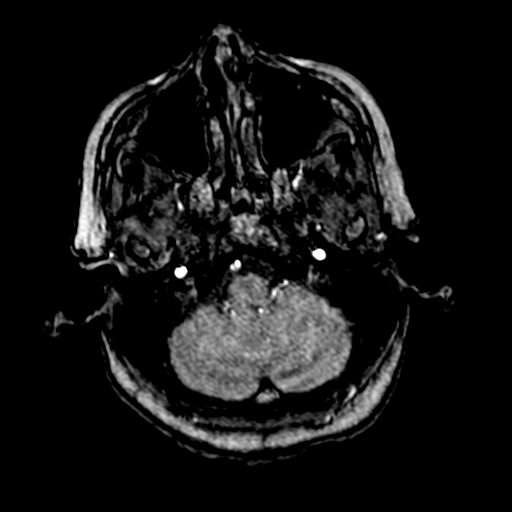
[im 37/172]
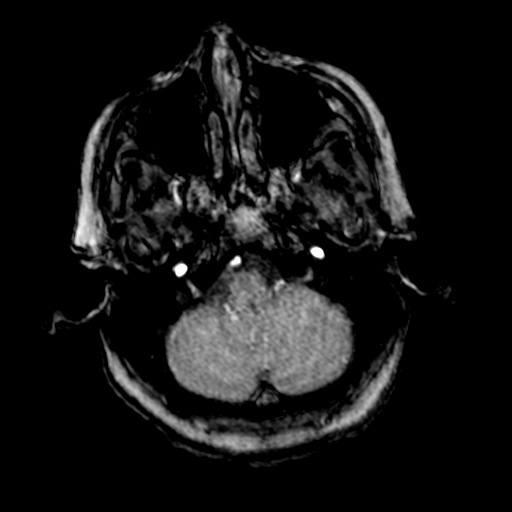
[im 55/172]
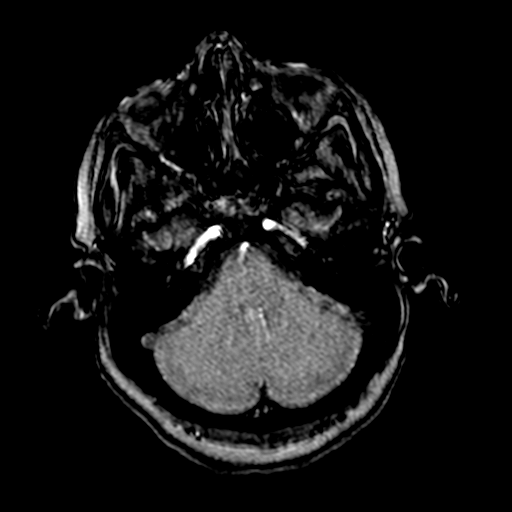
[im 77/172]
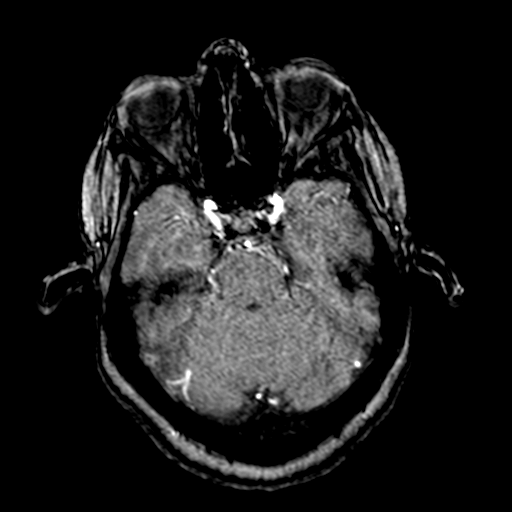
[im 88/172]
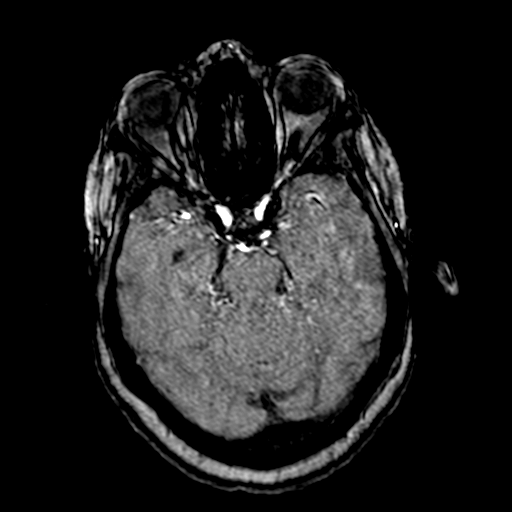
[im 99/172]
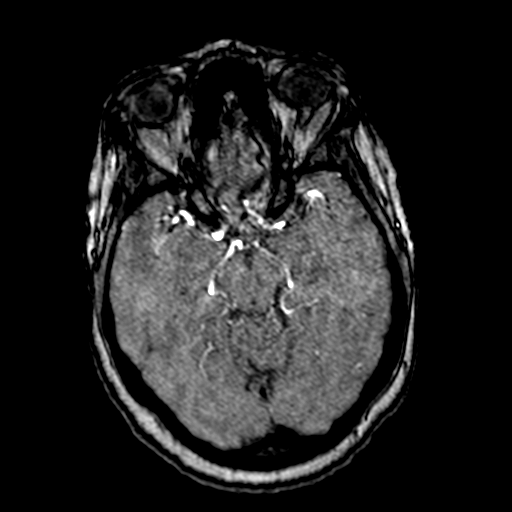
[im 121/172]
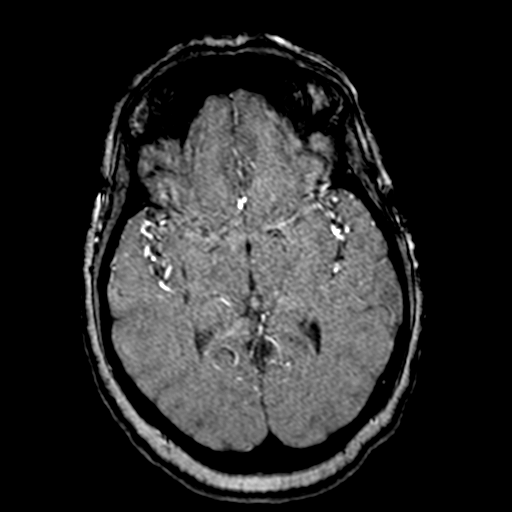
[im 142/172]
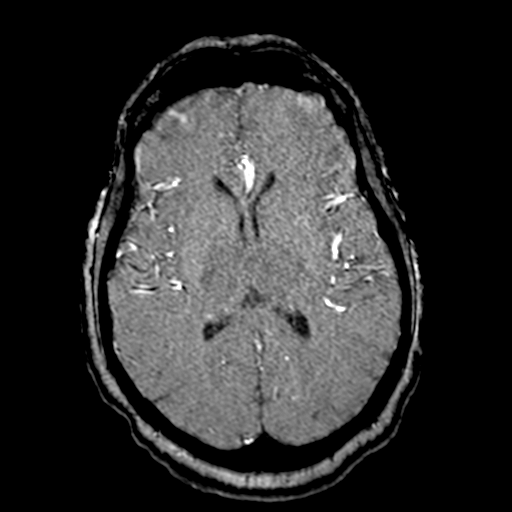
[im 146/172]
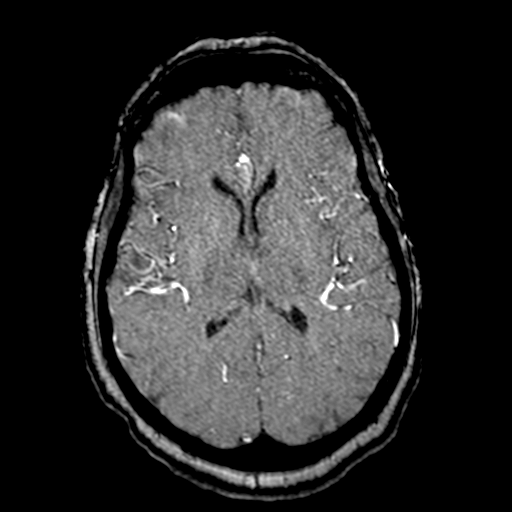
[im 164/172]
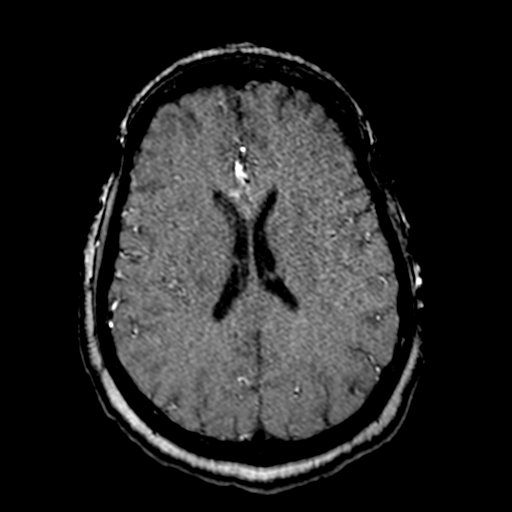

[19 of 48 positions shown; findings below may reference images not displayed]

FINDINGS: The study is intermittently moderately to severely motion degraded.

The visualized distal vertebral arteries are patent to the basilar
with the left being moderately dominant. Patent PICA and SCA origins
are identified bilaterally. The basilar artery is patent with
assessment for stenosis limited by motion artifact including severe
focal motion through its midportion. There are left larger than
right posterior communicating arteries with a fetal origin of the
left PCA. Both PCAs are patent without evidence of significant
proximal stenosis.

The internal carotid arteries are patent from skull base to carotid
termini with motion artifact limiting assessment for stenosis. ACAs
and MCAs are patent without evidence of proximal branch occlusion.
No aneurysm is identified.
IMPRESSION: Motion degraded examination limiting assessment for arterial
stenosis. No major vessel occlusion.

## 2019-06-29 IMAGING — MR MR [PERSON_NAME] LOW W/O CM*L*
10 series · 40 of 40 positions shown · non-contrast
Comparison: X-ray [DATE]

CLINICAL DATA: Limb threatening ischemia. Recent left common iliac
artery stent placement. Clinical concern for osteomyelitis

EXAM:
MRI OF LOWER LEFT EXTREMITY WITHOUT CONTRAST
TECHNIQUE: Multiplanar, multisequence MR imaging of the left tibia and fibula
was performed. No intravenous contrast was administered.

[Series 5: composed cor t1_comp · coronal · left · 4.4mm · 1.15mm/px · 3 of 29 slices shown]
[im 1/29]
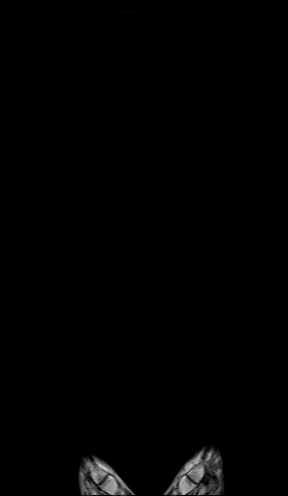
[im 15/29]
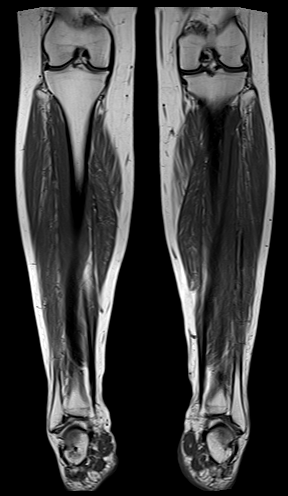
[im 29/29]
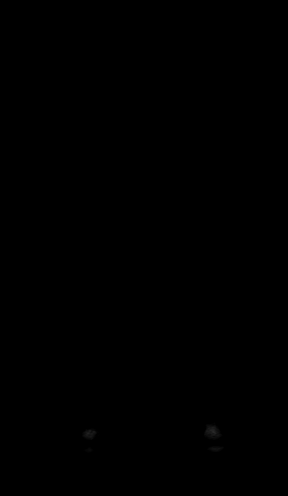

[Series 6: composed cor t1_comp_filt · coronal · left · 4.4mm · 1.15mm/px · 3 of 29 slices shown]
[im 1/29]
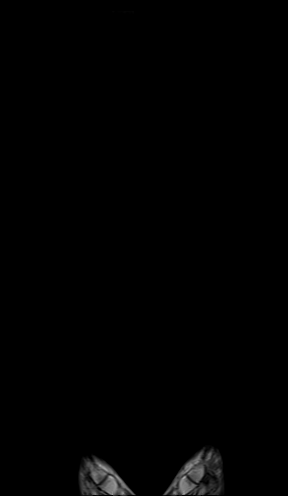
[im 15/29]
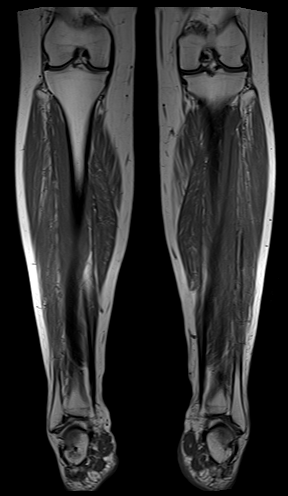
[im 29/29]
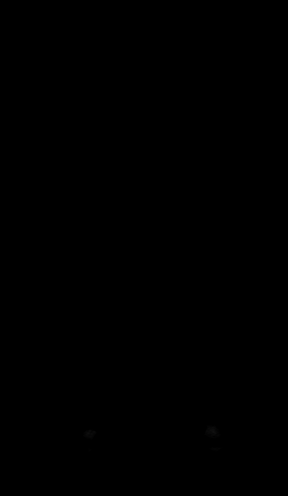

[Series 9: composed cor stir_comp · coronal · left · 4.4mm · 1.15mm/px · 3 of 28 slices shown (1 of 2)]
[im 1/28]
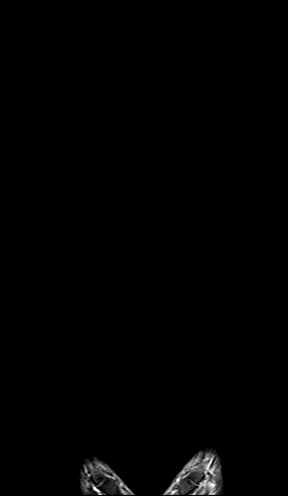
[im 14/28]
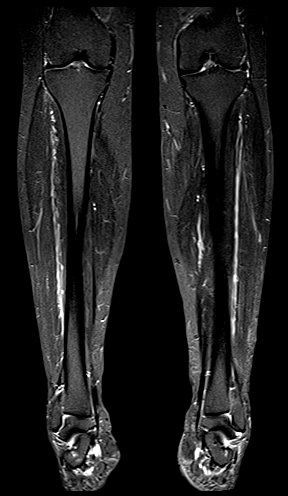
[im 28/28]
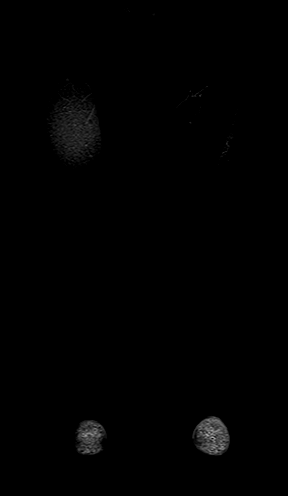

[Series 10: composed cor stir_comp_filt · coronal · left · 4.4mm · 1.15mm/px · 3 of 29 slices shown (1 of 2)]
[im 1/29]
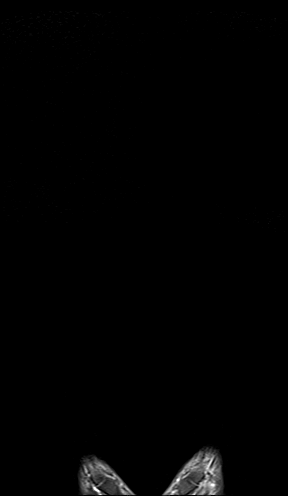
[im 15/29]
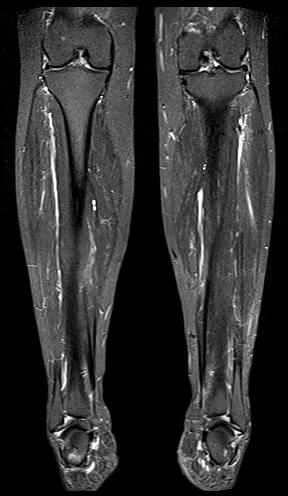
[im 29/29]
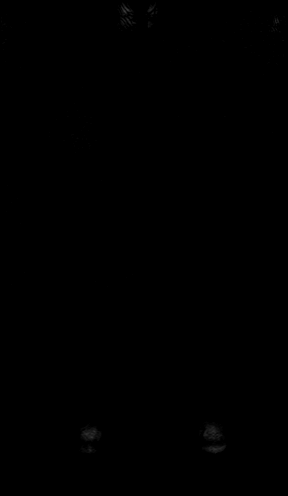

[Series 13: ax t1_comp · axial · left · 5.0mm · 0.70mm/px · z∈[-244,+204]mm · 8 of 76 slices shown]
[im 1/76]
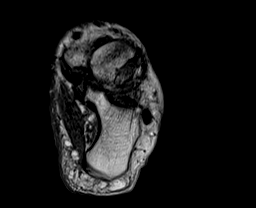
[im 11/76]
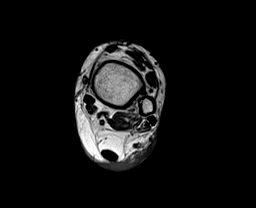
[im 22/76]
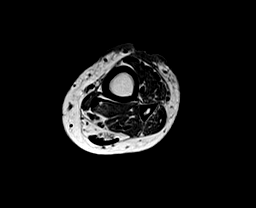
[im 33/76]
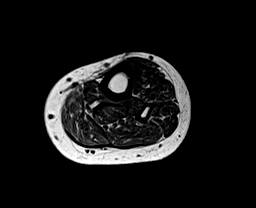
[im 43/76]
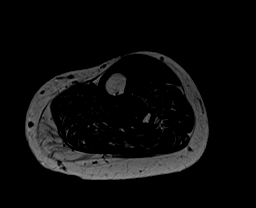
[im 54/76]
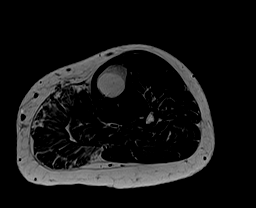
[im 65/76]
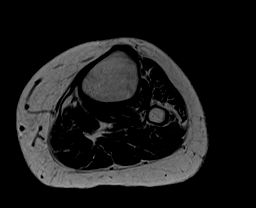
[im 76/76]
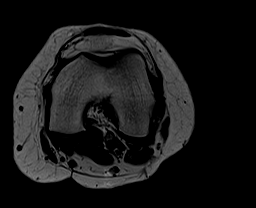

[Series 14: T2 fat-sat · axial · left · 5.0mm · 0.87mm/px · z∈[-18,+203]mm · 4 of 38 slices shown (1 of 2)]
[im 1/38]
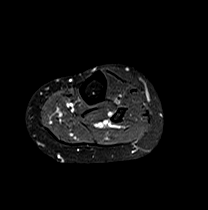
[im 13/38]
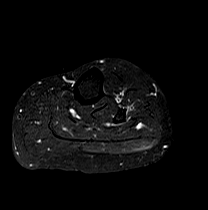
[im 25/38]
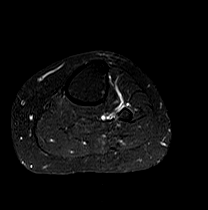
[im 38/38]
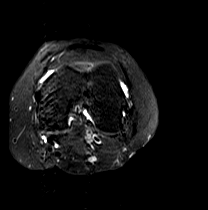

[Series 20: T2 · sagittal · left · 5.0mm · 1.30mm/px · 4 of 32 slices shown]
[im 1/32]
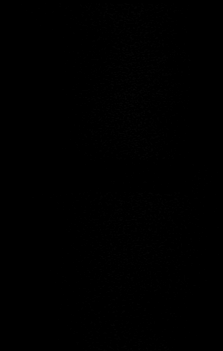
[im 11/32]
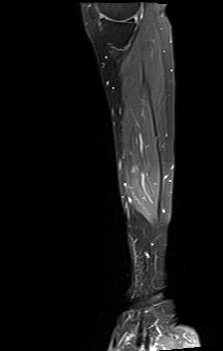
[im 21/32]
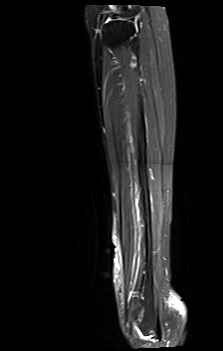
[im 32/32]
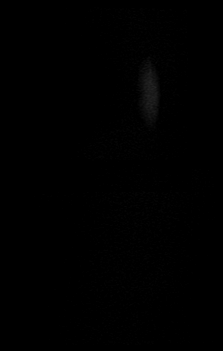

[Series 23: composed cor stir_comp · sagittal · left · 5.0mm · 0.87mm/px · 4 of 32 slices shown (2 of 2)]
[im 1/32]
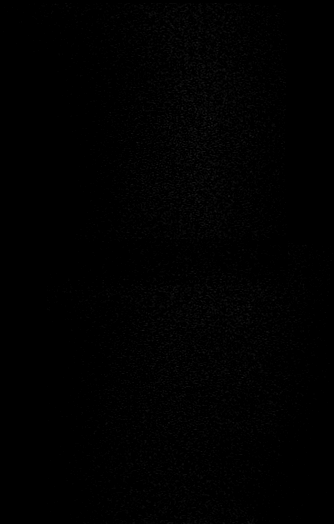
[im 11/32]
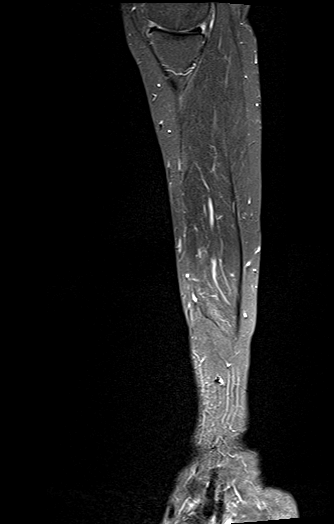
[im 21/32]
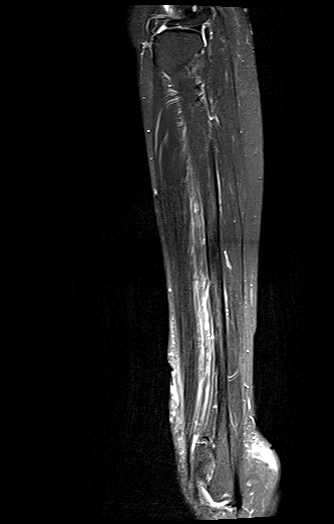
[im 32/32]
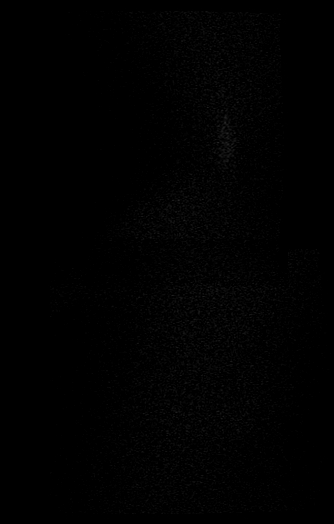

[Series 24: composed cor stir_comp_filt · sagittal · left · 5.0mm · 0.87mm/px · 4 of 32 slices shown (2 of 2)]
[im 1/32]
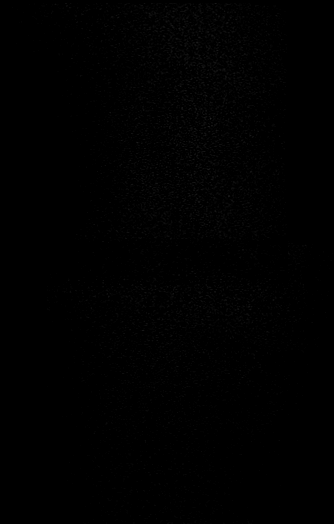
[im 11/32]
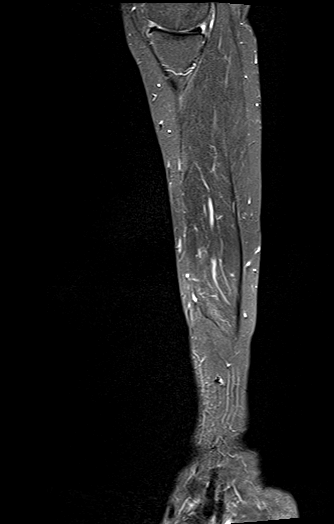
[im 21/32]
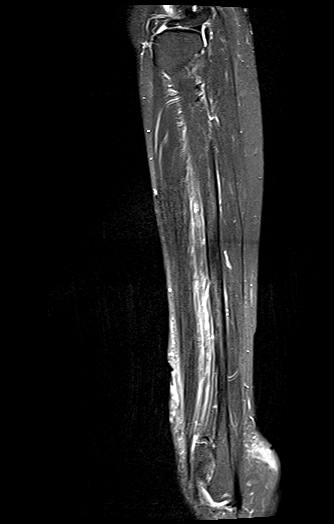
[im 32/32]
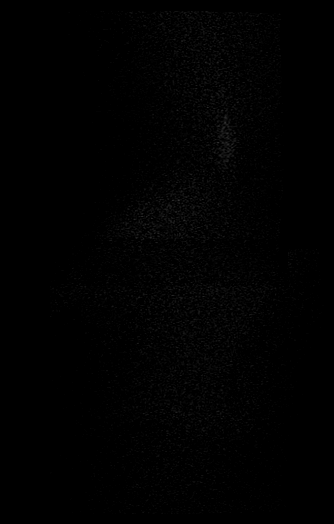

[Series 25: T2 fat-sat · axial · left · 5.0mm · 0.87mm/px · z∈[-245,-23]mm · 4 of 38 slices shown (2 of 2)]
[im 1/38]
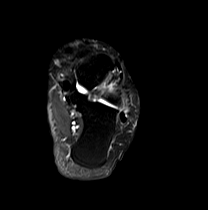
[im 13/38]
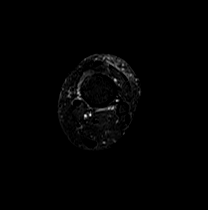
[im 25/38]
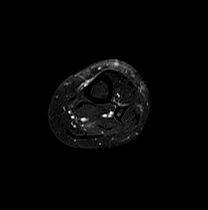
[im 38/38]
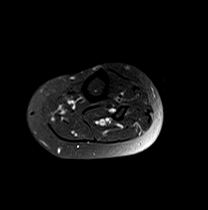

[40 of 40 positions shown; findings below may reference images not displayed]

FINDINGS: Bones/Joint/Cartilage

No acute fracture. No malalignment. There is preservation of the
fatty T1 marrow signal throughout the visualized osseous structures.
No bone marrow edema or marrow replacing lesion. No cortical
destruction or periostitis. No knee or tibiotalar joint effusion.
Suspect horizontal tear of the lateral meniscal body (series 10,
image 18) which is suboptimally characterized on large field-of-view
images.

Ligaments

No evidence of ligamentous injury.

Muscles and Tendons

Normal muscle bulk and signal intensity without intramuscular edema,
atrophy, or fatty infiltration. Visualized tendinous structures are
intact without injury or fluid collection.

Soft tissues

Soft tissue defect involving the anterior subcutaneous soft tissues
overlying the distal tibial diaphysis (series 25, image 18). Defect
involves the superficial soft tissues with preservation of the
underlying investing fascia of the anterior compartment musculature.
There is surrounding soft tissue edema and skin thickening
suggestive of cellulitis. No well-defined or drainable fluid
collections. No deep fascial fluid.
IMPRESSION: 1. Soft tissue defect/ulceration involving the anterior subcutaneous
soft tissues overlying the distal tibial diaphysis with surrounding
soft tissue edema and skin thickening suggestive of cellulitis. No
well-defined or drainable fluid collections.
2. No evidence of osteomyelitis.
3. Suspect horizontal tear of the lateral meniscal body which is
suboptimally characterized on large field-of-view images.

These findings are in agreement with the preliminary report provided
by Dr. MIMY ORANGE at [DATE] a.m. on [DATE] which was documented in
PACS of.

## 2019-06-29 IMAGING — MR MR HEAD WO/W CM
12 of 14 series · 40 of 48 positions shown · IV contrast (Contrast agent)
Comparison: Prior head CT from [DATE].

CLINICAL DATA: Initial evaluation for increased confusion,
incoherent speech, lethargy.

EXAM:
MRI HEAD WITHOUT AND WITH CONTRAST
TECHNIQUE: Multiplanar, multiecho pulse sequences of the brain and surrounding
structures were obtained without and with intravenous contrast.
CONTRAST:  8mL GADAVIST GADOBUTROL 1 MMOL/ML IV SOLN

[Series 5: DWI · axial · 3.0mm · 0.88mm/px · z∈[-101,+40]mm · 8 of 96 slices shown (1 of 4)]
[im 1/96]
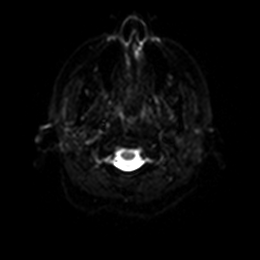
[im 14/96]
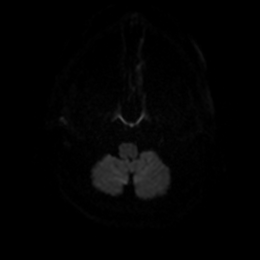
[im 28/96]
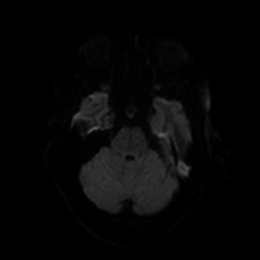
[im 41/96]
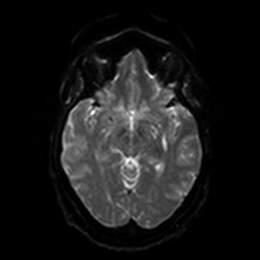
[im 55/96]
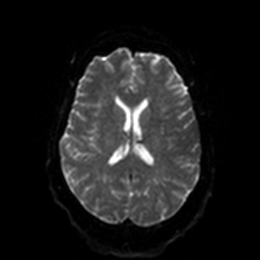
[im 68/96]
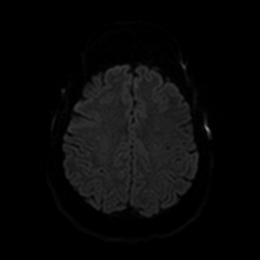
[im 82/96]
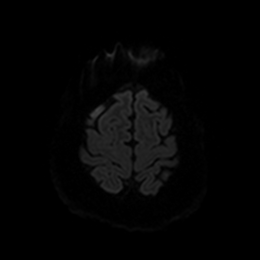
[im 96/96]
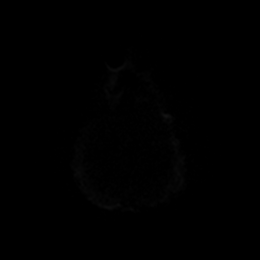

[Series 6: DWI · axial · 3.0mm · 0.88mm/px · z∈[-101,+40]mm · 4 of 48 slices shown (2 of 4)]
[im 1/48]
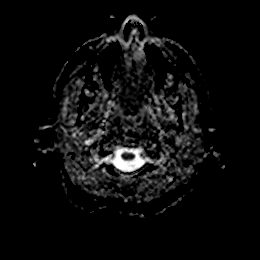
[im 16/48]
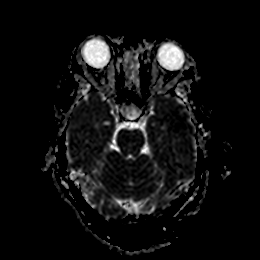
[im 32/48]
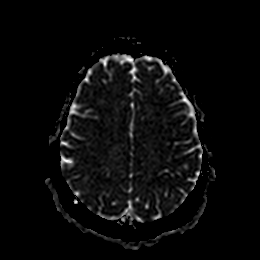
[im 48/48]
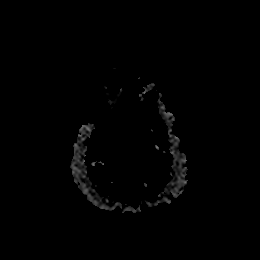

[Series 7: DWI · coronal · 4.0mm · 0.88mm/px · 5 of 68 slices shown (3 of 4)]
[im 1/68]
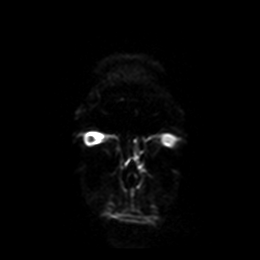
[im 17/68]
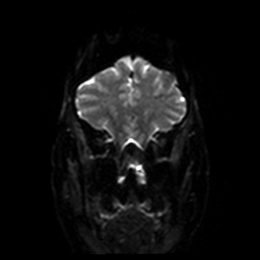
[im 34/68]
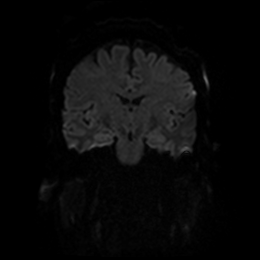
[im 51/68]
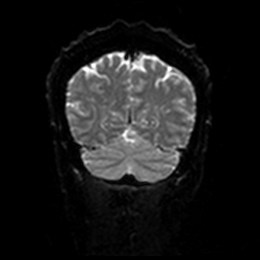
[im 68/68]
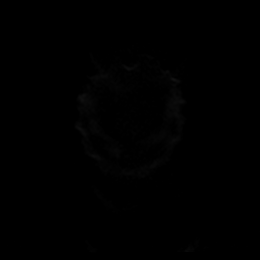

[Series 8: DWI · coronal · 4.0mm · 0.88mm/px · 3 of 34 slices shown (4 of 4)]
[im 1/34]
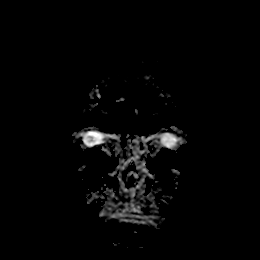
[im 17/34]
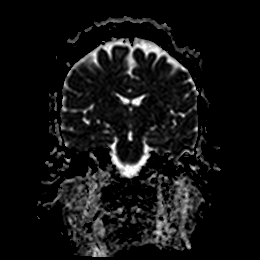
[im 34/34]
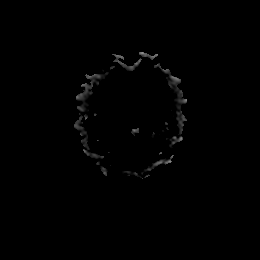

[Series 9: T1 · sagittal · 5.0mm · 0.75mm/px · 2 of 25 slices shown]
[im 1/25]
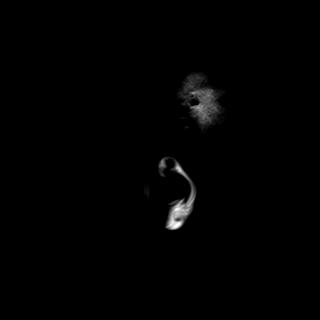
[im 25/25]
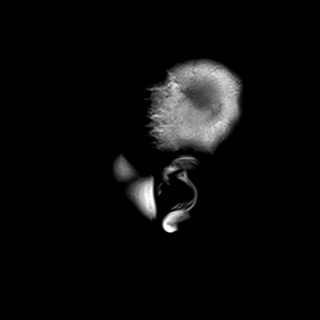

[Series 10: T2 · axial · 5.0mm · 0.72mm/px · z∈[-103,+41]mm · 2 of 25 slices shown]
[im 1/25]
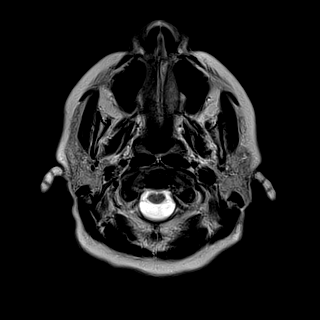
[im 25/25]
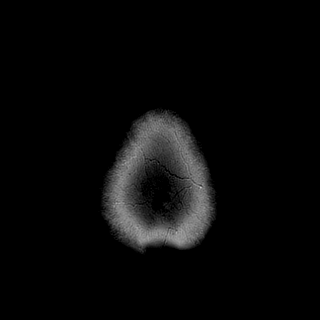

[Series 13: pha_images · axial · 3.0mm · 0.90mm/px · z∈[-117,+56]mm · 4 of 59 slices shown]
[im 1/59]
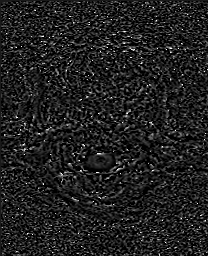
[im 20/59]
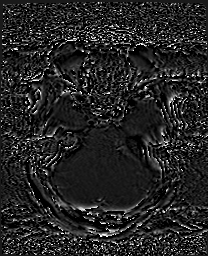
[im 39/59]
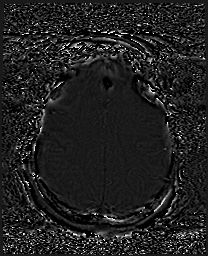
[im 59/59]
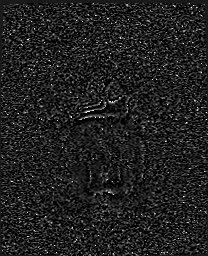

[Series 14: swi_images · axial · 3.0mm · 0.90mm/px · z∈[-120,+56]mm · 4 of 60 slices shown]
[im 1/60]
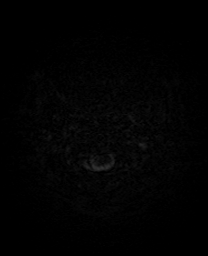
[im 20/60]
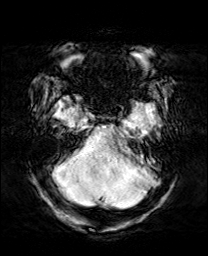
[im 40/60]
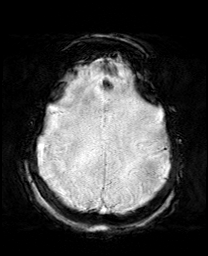
[im 60/60]
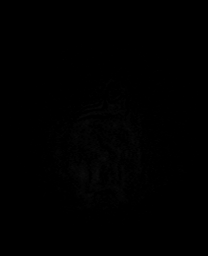

[Series 17: FLAIR · axial · 5.0mm · 0.45mm/px · z∈[-104,+40]mm · 2 of 25 slices shown]
[im 1/25]
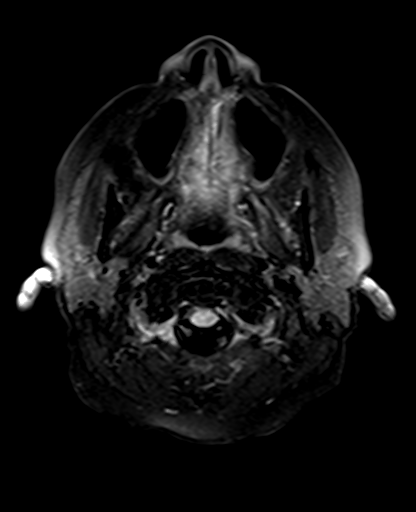
[im 25/25]
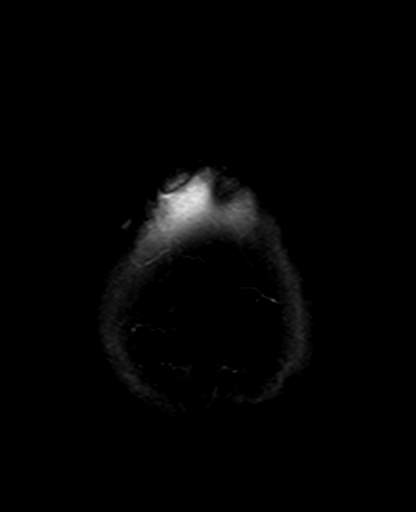

[Series 18: T2 post-contrast · coronal · 5.0mm · 0.72mm/px · 2 of 29 slices shown]
[im 1/29]
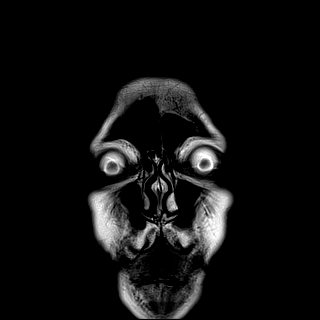
[im 29/29]
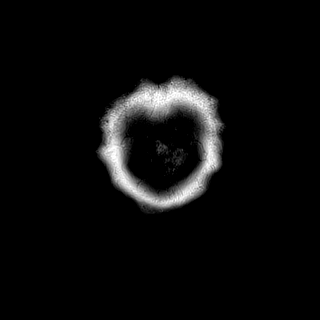

[Series 20: T1 post-contrast · coronal · 5.0mm · 0.34mm/px · 2 of 29 slices shown (1 of 2)]
[im 1/29]
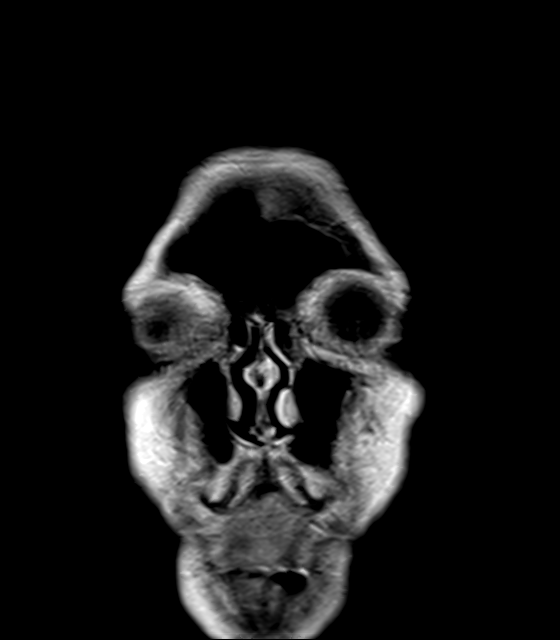
[im 29/29]
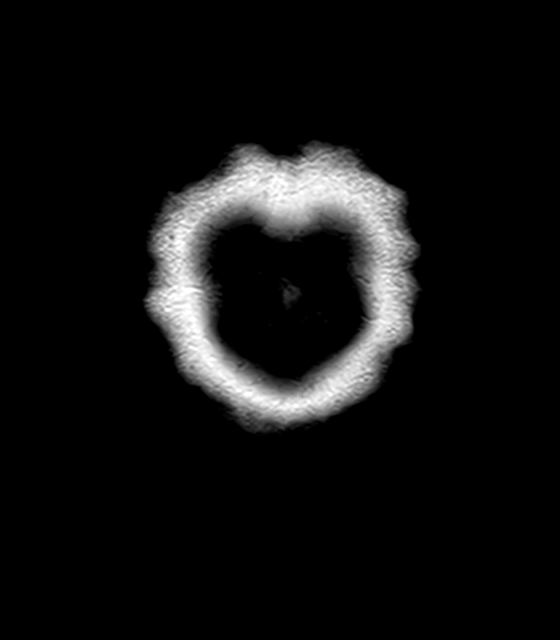

[Series 21: T1 post-contrast · sagittal · 5.0mm · 0.75mm/px · 2 of 25 slices shown (2 of 2)]
[im 1/25]
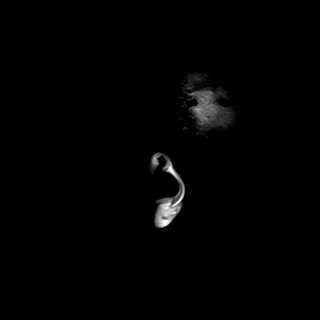
[im 25/25]
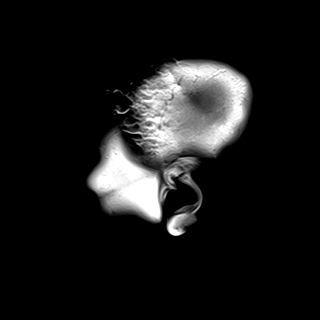

[40 of 48 positions shown; findings below may reference images not displayed]

FINDINGS: Brain: Examination mildly degraded by motion artifact.

Cerebral volume within normal limits for age. Mild scattered patchy
T2/FLAIR hyperintensity within the periventricular deep white matter
both cerebral hemispheres, most like related to mild chronic small
vessel ischemic disease.

There is a subtle 7 mm focus of restricted diffusion seen involving
the central aspect of the splenium (series 5, image 75). Signal
abnormality is somewhat linear in nature, and favored to reflect a
small acute ischemic infarct. No associated hemorrhage or mass
effect. No other evidence for acute or subacute ischemia. Gray-white
matter differentiation otherwise maintained. No encephalomalacia to
suggest chronic cortical infarction. No foci of susceptibility
artifact to suggest acute or chronic intracranial hemorrhage.

No mass lesion, midline shift or mass effect. No hydrocephalus. No
extra-axial fluid collection. Pituitary gland and suprasellar region
within normal limits. No abnormal enhancement.

Vascular: Major intracranial vascular flow voids are maintained.

Skull and upper cervical spine: Craniocervical junction normal.
Scattered degenerative changes noted within the upper cervical spine
without high-grade stenosis. Bone marrow signal intensity within
normal limits. No acute scalp soft tissue abnormality.

Sinuses/Orbits: Globes and orbital soft tissues within normal
limits. Paranasal sinuses are clear. No significant mastoid
effusion. Inner ear structures grossly normal.

Other: None.
IMPRESSION: 1. 7 mm focus of restricted diffusion involving the central aspect
of the splenium of the splenium of the brain, favored to reflect a
small acute ischemic infarct. No associated hemorrhage or mass
effect. Primary differential considerations include sequelae of
seizure versus toxic metabolic derangement.
2. No other acute intracranial process identified.
3. Mild chronic microvascular ischemic disease.

## 2019-06-29 MED ORDER — CLOPIDOGREL BISULFATE 75 MG PO TABS
75.0000 mg | ORAL_TABLET | Freq: Every day | ORAL | Status: DC
  Administered 2019-06-30 – 2019-07-01 (×2): 75 mg via ORAL
  Filled 2019-06-29 (×2): qty 1

## 2019-06-29 MED ORDER — STROKE: EARLY STAGES OF RECOVERY BOOK
Freq: Once | Status: AC
  Filled 2019-06-29: qty 1

## 2019-06-29 MED ORDER — GADOBUTROL 1 MMOL/ML IV SOLN
8.0000 mL | Freq: Once | INTRAVENOUS | Status: AC | PRN
  Administered 2019-06-29: 8 mL via INTRAVENOUS

## 2019-06-29 MED ORDER — SODIUM CHLORIDE 0.9 % IV SOLN
2.0000 g | Freq: Two times a day (BID) | INTRAVENOUS | Status: DC
  Filled 2019-06-29 (×2): qty 20

## 2019-06-29 MED ORDER — CEFAZOLIN SODIUM-DEXTROSE 2-4 GM/100ML-% IV SOLN
2.0000 g | Freq: Three times a day (TID) | INTRAVENOUS | Status: DC
  Administered 2019-06-29 – 2019-07-01 (×7): 2 g via INTRAVENOUS
  Filled 2019-06-29 (×9): qty 100

## 2019-06-29 MED ORDER — ASPIRIN EC 81 MG PO TBEC
81.0000 mg | DELAYED_RELEASE_TABLET | Freq: Every day | ORAL | Status: DC
  Administered 2019-06-30 – 2019-07-01 (×2): 81 mg via ORAL
  Filled 2019-06-29 (×2): qty 1

## 2019-06-29 NOTE — Progress Notes (Signed)
Pharmacy Antibiotic Note  Lauren Brown is a 59 y.o. female admitted on 06/27/2019 with wound infection/staph aureus bacteremia. Noted history of MSSA from left leg wound. Pharmacy has been consulted for cefazolin dosing. WBC 7.8 and Tmax 101. SCr < 1 and ~ CrCl 85 ml/min.   Plan: Cefazolin 2 gm q 8 hours F/u cultures and renal function Pharmacy will sign off cefazolin consult and monitor renal function peripherally.   Height: 5\' 7"  (170.2 cm) Weight: 181 lb 14.1 oz (82.5 kg) IBW/kg (Calculated) : 61.6  Temp (24hrs), Avg:99.2 F (37.3 C), Min:98.5 F (36.9 C), Max:101 F (38.3 C)  Recent Labs  Lab 06/27/19 1802 06/28/19 1025 06/29/19 0911  WBC 17.1* 11.8* 7.8  CREATININE 0.88 0.84  --   LATICACIDVEN 1.8  --   --     Estimated Creatinine Clearance: 80.7 mL/min (by C-G formula based on SCr of 0.84 mg/dL).    No Known Allergies    Thank you for allowing pharmacy to be a part of this patient's care.  08/27/19, PharmD, BCPS, BCIDP Infectious Diseases Clinical Pharmacist Phone: [phone] 06/29/2019 10:05 AM

## 2019-06-29 NOTE — Evaluation (Signed)
Occupational Therapy Evaluation Patient Details Name: Lauren Brown MRN: [id number] DOB: September 16, 1960 Today's Date: 06/29/2019    History of Present Illness Pt is a 59 yo female s/p AMS resulting in MRI brain:  restricted diffusion involving   Clinical Impression   Pt PTA: living independently and working. Pt currently with no focal deficits. Pt A/O x4. Pt performing bathing, grooming tasks in standing at sink with no physical assist. Pt with no LOB episodes. Pt ambulating with no AD in room and IV pole for supervision. HR 60s - 102 BPM with exertion. Pt does not require continued OT skilled services for ADL, mobility and safety. OT signing off.    Follow Up Recommendations  No OT follow up    Equipment Recommendations  None recommended by OT    Recommendations for Other Services       Precautions / Restrictions Precautions Precautions: Fall Restrictions Weight Bearing Restrictions: No      Mobility Bed Mobility Overal bed mobility: Modified Independent                Transfers Overall transfer level: Modified independent                    Balance Overall balance assessment: No apparent balance deficits (not formally assessed)                                         ADL either performed or assessed with clinical judgement   ADL Overall ADL's : At baseline                                       General ADL Comments: Pt performing bathing, grooming tasks in standing at sink with no physical assist. Pt with no LOB episodes.     Vision Baseline Vision/History: No visual deficits Vision Assessment?: No apparent visual deficits     Perception     Praxis      Pertinent Vitals/Pain Pain Assessment: No/denies pain     Hand Dominance Right   Extremity/Trunk Assessment Upper Extremity Assessment Upper Extremity Assessment: Overall WFL for tasks assessed   Lower Extremity Assessment Lower Extremity Assessment:  Overall WFL for tasks assessed   Cervical / Trunk Assessment Cervical / Trunk Assessment: Normal   Communication Communication Communication: No difficulties   Cognition Arousal/Alertness: Awake/alert Behavior During Therapy: WFL for tasks assessed/performed Overall Cognitive Status: Within Functional Limits for tasks assessed                                     General Comments  VSS. 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, driving; spouse does not work; mother is home too        OT Problem List: Decreased activity tolerance      OT Treatment/Interventions:      OT Goals(Current goals can be found in the care plan section) Acute Rehab OT Goals Patient Stated Goal: to go home  OT Frequency:     Barriers to D/C:            Co-evaluation PT/OT/SLP Co-Evaluation/Treatment: Yes Reason for Co-Treatment: For patient/therapist safety   OT goals addressed during session: ADL's and self-care      AM-PAC OT "6 Clicks" Daily Activity     Outcome Measure Help from another person eating meals?: None Help from another person taking care of personal grooming?: None Help from another person toileting, which includes using toliet, bedpan, or urinal?: None Help from another person bathing (including washing, rinsing, drying)?: None Help from another person to put on and  taking off regular upper body clothing?: None Help from another person to put on and taking off regular lower body clothing?: None 6 Click Score: 24   End of Session Nurse Communication: Mobility status  Activity Tolerance: Patient tolerated treatment well Patient left: Other (comment)(up walking with PT)  OT Visit Diagnosis: Unsteadiness on feet (R26.81);Muscle weakness (generalized) (M62.81)                Time: 9702-6378 OT Time Calculation (min): 26 min Charges:  OT General Charges $OT Visit: 1 Visit OT Evaluation $OT Eval Moderate Complexity: 1 Mod  Flora Lipps OTR/L Acute Rehabilitation Services Pager: [phone] Office: [phone]   Kenidi Elenbaas C 06/29/2019, 11:22 AM

## 2019-06-29 NOTE — Progress Notes (Signed)
PROGRESS NOTE   Lauren Brown  ZOX:[id number]    DOB: Apr 28, 1961    DOA: 06/27/2019  PCP: Patient, No Pcp Per   I have briefly reviewed patients [facility] medical [facility].  Chief Complaint:   Chief Complaint  Patient presents with  . Altered Mental Status    Brief Narrative:  59 year old female with PMH of peripheral artery disease, hypertension, hyperlipidemia, recent limb threatening ischemia, s/p left iliac artery stenting on 06/24/2019, followed at the wound care center for chronic left lower leg wound, cultures from 03/2019 showed MSSA, wound reportedly was getting better after stenting but still had pink, bloody drainage with a foul odor, initially presented to [facility] on 06/26/2018 due to acute confusion and fever of 101 F.  COVID-19 test was negative.  There was concern regarding acute meningoencephalitis for which she was started on broad-spectrum antimicrobials and transferred to [facility] on 1/3 for possible lumbar puncture and neurology evaluation.  Neurology and ID consulted.  Mental status changes have resolved.  Currently with working diagnosis of acute metabolic encephalopathy due to staphylococcal bacteremia from infected chronic left leg wound.   Assessment & Plan:  Principal Problem:   Bacteremia due to Staphylococcus aureus Active Problems:   PVD (peripheral vascular disease) (HCC)   AMS (altered mental status)   Essential hypertension   Hyperlipidemia   Altered mental status   Leg wound, left   Hyperglycemia   Cerebral thrombosis with cerebral infarction   Sepsis due to Staph aureus bacteremia, suspected due to infected chronic left leg wound  There was initial concern for meningoencephalitis and patient was started on broad-spectrum antimicrobials and transferred [facility] to [facility] for LP, neurology and ID consultation.  ID consultation appreciated.  Continuing IV vancomycin and have started  cefazolin pending antibiotic sensitivity result.  Discontinued ceftriaxone, ampicillin and acyclovir.  Getting surveillance blood cultures.  Hold off on PICC line placement.  TTE negative for vegetations.  Urine culture shows Staph epidermidis,?  Significance.  No UTI symptoms.  Acute ischemic stroke  MRI brain: Several millimeters focus of restricted diffusion involving the central aspect of the splenium of the brain, favored to reflect a small acute ischemic infarct.  No hemorrhage or mass.  TTE: LVEF 60-65%.  No obvious valvular vegetation  Carotid Dopplers: Right ICA 1-39% stenosis.  Left ICA 40-59% stenosis.  Bilateral vertebral arteries with antegrade flow.  Neurology consultation appreciated.  Suspect acute stroke related to small vessel disease.  Okay with resuming aspirin and Plavix given recent iliac stent.  Follow A1c and lipid profile.  Stroke team to follow in a.m.  Infected chronic left leg wound  MR of left leg: Soft tissue defect/ulceration involving anterior subcutaneous soft tissues overlying the distal tibial diaphysis with surrounding soft tissue edema and skin thickening suggestive of cellulitis.  No drainable fluid.  No osteomyelitis.  Wound care consultation.  Antibiotic management as above.  Acute metabolic encephalopathy  Most likely due to bacteremia and cellulitis.  Less likely due to acute stroke.  Low index of suspicion for meningoencephalitis, LP deferred and broad-spectrum antimicrobials discontinued.  B12: 212.  Consider B12 supplements as outpatient.  Peripheral artery disease  S/p left common iliac artery stent placement 12/30 [facility] for left lower extremity limb threatening ischemia.  Aspirin and Plavix resumed.  Dyslipidemia  Continue statins.  Mild anemia  Follow CBC.  Low TSH  Clinically euthyroid.  Unclear significance or etiology.  Check free T3 and free  T4, repeat TSH   DVT prophylaxis: Lovenox Code Status: Full Family  Communication: None at bedside Disposition: DC home pending clinical improvement   Consultants:   Neurology Infectious disease  Procedures:   None  Antimicrobials:   IV vancomycin and cefazolin.  Rest discontinued   Subjective:  Patient is alert and oriented this morning.  Complains of some pain at the left cubital fossa IV site but otherwise denies complaints.  Denies pain in her left leg.  Objective:   Vitals:   06/29/19 0202 06/29/19 0329 06/29/19 0435 06/29/19 0910  BP: 124/75 133/69  123/69  Pulse: 61 65  60  Resp: 14 19  20   Temp: 98.5 F (36.9 C) 99 F (37.2 C)  98.8 F (37.1 C)  TempSrc: Oral Oral    SpO2: 100% 100%  98%  Weight:   82.5 kg   Height:        General exam: Pleasant young female, moderately built and overweight lying comfortably propped up in bed without distress.  Oral mucosa moist. Respiratory system: Clear to auscultation. Respiratory effort normal. Cardiovascular system: S1 & S2 heard, RRR. No JVD, murmurs, rubs, gallops or clicks. No pedal edema.  Telemetry personally reviewed: Sinus rhythm. Gastrointestinal system: Abdomen is nondistended, soft and nontender. No organomegaly or masses felt. Normal bowel sounds heard. Central nervous system: Alert and oriented. No focal neurological deficits. Extremities: Symmetric 5 x 5 power. Skin: Left leg chronic wound dressing just changed, clean and dry.  Will assess wound in a.m. Psychiatry: Judgement and insight appear normal. Mood & affect appropriate.     Data Reviewed:   I have personally reviewed following labs and imaging studies   CBC: Recent Labs  Lab 06/27/19 1802 06/28/19 1025 06/29/19 0911  WBC 17.1* 11.8* 7.8  NEUTROABS 15.3*  --   --   HGB 12.6 12.1 11.9*  HCT 39.8 37.9 36.5  MCV 86.1 84.0 81.7  PLT 264 202 454    Basic Metabolic Panel: Recent Labs  Lab 06/27/19 1802 06/28/19 1025 06/29/19 0911  NA 133* 130* 134*  K 3.3* 3.2* 3.6  CL 97* 98 99  CO2 22 22 26     GLUCOSE 117* 233* 115*  BUN 12 15 7   CREATININE 0.88 0.84 0.72  CALCIUM 8.3* 8.2* 8.2*    Liver Function Tests: Recent Labs  Lab 06/27/19 1802 06/28/19 1025 06/29/19 0911  AST 18 32 33  ALT 13 20 21   ALKPHOS 82 71 66  BILITOT 1.3* 0.9 0.5  PROT 7.9 7.0 6.1*  ALBUMIN 3.5 3.1* 2.6*    CBG: No results for input(s): GLUCAP in the last 168 hours.  Microbiology Studies:   Recent Results (from the past 240 hour(s))  Urine culture     Status: Abnormal (Preliminary result)   Collection Time: 06/27/19  5:33 PM   Specimen: In/Out Cath Urine  Result Value Ref Range Status   Specimen Description   Final    IN/OUT CATH URINE Performed at [facility], [address] [place], [place] [address]    Special Requests   Final    NONE Performed at [facility], [address], [place], [place] [address]    Culture (A)  Final    >=100,000 COLONIES/mL STAPHYLOCOCCUS EPIDERMIDIS SUSCEPTIBILITIES TO FOLLOW Performed at [facility] [address] [place], [place] [address]    Report Status PENDING  Incomplete  Blood Culture (routine x 2)     Status: Abnormal (Preliminary result)   Collection Time: 06/27/19  6:02  PM   Specimen: Right Antecubital; Blood  Result Value Ref Range Status   Specimen Description   Final    RIGHT ANTECUBITAL Performed at [facility], [address] [place], [place] [address]    Special Requests   Final    BOTTLES DRAWN AEROBIC AND ANAEROBIC Blood Culture adequate volume Performed at [facility], [address], [place], [place] [address]    Culture  Setup Time   Final    GRAM POSITIVE COCCI AEROBIC BOTTLE ONLY Gram Stain Report Called to,Read Back By and Verified With: RACHAEL H.,RN @0742  06/28/2019 KAY Performed at [facility], [address] [place], [place]    Culture STAPHYLOCOCCUS AUREUS (A)  Final   Report Status PENDING  Incomplete  SARS CORONAVIRUS 2 (TAT 6-24 HRS) Nasopharyngeal Nasopharyngeal Swab     Status: None    Collection Time: 06/27/19  7:57 PM   Specimen: Nasopharyngeal Swab  Result Value Ref Range Status   SARS Coronavirus 2 NEGATIVE NEGATIVE Final    Comment: (NOTE) SARS-CoV-2 target nucleic acids are NOT DETECTED. The SARS-CoV-2 RNA is generally detectable in upper and lower respiratory specimens during the acute phase of infection. Negative results do not preclude SARS-CoV-2 infection, do not rule out co-infections with other pathogens, and should not be used as the sole basis for treatment or other patient management decisions. Negative results must be combined with clinical observations, patient history, and epidemiological information. The expected result is Negative. Fact Sheet for Patients: 08/25/19 Fact Sheet for Healthcare Providers: HairSlick.no This test is not yet approved or cleared by the quierodirigir.com FDA and  has been authorized for detection and/or diagnosis of SARS-CoV-2 by FDA under an Emergency Use Authorization (EUA). This EUA will remain  in effect (meaning this test can be used) for the duration of the COVID-19 declaration under Section 56 4(b)(1) of the Act, 21 U.S.C. section 360bbb-3(b)(1), unless the authorization is terminated or revoked sooner. Performed at [facility], [address], [place], [place]      Radiology Studies:  MR BRAIN W WO CONTRAST  Result Date: 06/29/2019 CLINICAL DATA:  Initial evaluation for increased confusion, incoherent speech, lethargy. EXAM: MRI HEAD WITHOUT AND WITH CONTRAST TECHNIQUE: Multiplanar, multiecho pulse sequences of the brain and surrounding structures were obtained without and with intravenous contrast. CONTRAST:  11mL GADAVIST GADOBUTROL 1 MMOL/ML IV SOLN COMPARISON:  Prior head CT from 06/27/2019. FINDINGS: Brain: Examination mildly degraded by motion artifact. Cerebral volume within normal limits for age. Mild scattered patchy T2/FLAIR  hyperintensity within the periventricular deep white matter both cerebral hemispheres, most like related to mild chronic small vessel ischemic disease. There is a subtle 7 mm focus of restricted diffusion seen involving the central aspect of the splenium (series 5, image 75). Signal abnormality is somewhat linear in nature, and favored to reflect a small acute ischemic infarct. No associated hemorrhage or mass effect. No other evidence for acute or subacute ischemia. Gray-white matter differentiation otherwise maintained. No encephalomalacia to suggest chronic cortical infarction. No foci of susceptibility artifact to suggest acute or chronic intracranial hemorrhage. No mass lesion, midline shift or mass effect. No hydrocephalus. No extra-axial fluid collection. Pituitary gland and suprasellar region within normal limits. No abnormal enhancement. Vascular: Major intracranial vascular flow voids are maintained. Skull and upper cervical spine: Craniocervical junction normal. Scattered degenerative changes noted within the upper cervical spine without high-grade stenosis. Bone marrow signal intensity within normal limits. No acute scalp soft tissue abnormality. Sinuses/Orbits: Globes and  orbital soft tissues within normal limits. Paranasal sinuses are clear. No significant mastoid effusion. Inner ear structures grossly normal. Other: None. IMPRESSION: 1. 7 mm focus of restricted diffusion involving the central aspect of the splenium of the splenium of the brain, favored to reflect a small acute ischemic infarct. No associated hemorrhage or mass effect. Primary differential considerations include sequelae of seizure versus toxic metabolic derangement. 2. No other acute intracranial process identified. 3. Mild chronic microvascular ischemic disease. Electronically Signed   By: Rise MuBenjamin  McClintock M.D.   On: 06/29/2019 01:56   MR TIBIA FIBULA LEFT WO CONTRAST  Result Date: 06/29/2019 CLINICAL DATA:  Limb threatening  ischemia. Recent left common iliac artery stent placement. Clinical concern for osteomyelitis EXAM: MRI OF LOWER LEFT EXTREMITY WITHOUT CONTRAST TECHNIQUE: Multiplanar, multisequence MR imaging of the left tibia and fibula was performed. No intravenous contrast was administered. COMPARISON:  X-ray 04/11/2018 FINDINGS: Bones/Joint/Cartilage No acute fracture. No malalignment. There is preservation of the fatty T1 marrow signal throughout the visualized osseous structures. No bone marrow edema or marrow replacing lesion. No cortical destruction or periostitis. No knee or tibiotalar joint effusion. Suspect horizontal tear of the lateral meniscal body (series 10, image 18) which is suboptimally characterized on large field-of-view images. Ligaments No evidence of ligamentous injury. Muscles and Tendons Normal muscle bulk and signal intensity without intramuscular edema, atrophy, or fatty infiltration. Visualized tendinous structures are intact without injury or fluid collection. Soft tissues Soft tissue defect involving the anterior subcutaneous soft tissues overlying the distal tibial diaphysis (series 25, image 18). Defect involves the superficial soft tissues with preservation of the underlying investing fascia of the anterior compartment musculature. There is surrounding soft tissue edema and skin thickening suggestive of cellulitis. No well-defined or drainable fluid collections. No deep fascial fluid. IMPRESSION: 1. Soft tissue defect/ulceration involving the anterior subcutaneous soft tissues overlying the distal tibial diaphysis with surrounding soft tissue edema and skin thickening suggestive of cellulitis. No well-defined or drainable fluid collections. 2. No evidence of osteomyelitis. 3. Suspect horizontal tear of the lateral meniscal body which is suboptimally characterized on large field-of-view images. These findings are in agreement with the preliminary report provided by Dr. Jake SamplesFujinaga at 1:42 a.m. on  06/29/2019 which was documented in PACS of. Electronically Signed   By: Duanne GuessNicholas  Plundo D.O.   On: 06/29/2019 08:14   VAS US CAROTID  Result Date: 06/29/2019 Carotid Arterial Duplex Study Indications:       Speech disturbance. Comparison Study:  No prior study. Performing Technologist: Gertie FeyMichelle Simonetti MHA, RDMS, RVT, RDCS  Examination Guidelines: A complete evaluation includes B-mode imaging, spectral Doppler, color Doppler, and power Doppler as needed of all accessible portions of each vessel. Bilateral testing is considered an integral part of a complete examination. Limited examinations for reoccurring indications may be performed as noted.  Right Carotid Findings: +----------+-------+-------+--------+---------------------------------+--------+           PSV    EDV    StenosisPlaque Description               Comments           cm/s   cm/s                                                     +----------+-------+-------+--------+---------------------------------+--------+ CCA Prox  88     22                                                       +----------+-------+-------+--------+---------------------------------+--------+  CCA Distal81     24             irregular and heterogenous                +----------+-------+-------+--------+---------------------------------+--------+ ICA Prox  104    34             heterogenous, irregular and                                               calcific                                  +----------+-------+-------+--------+---------------------------------+--------+ ICA Distal87     29                                                       +----------+-------+-------+--------+---------------------------------+--------+ ECA       81     12             smooth and heterogenous                   +----------+-------+-------+--------+---------------------------------+--------+  +----------+--------+-------+----------------+-------------------+           PSV cm/sEDV cmsDescribe        Arm Pressure (mmHG) +----------+--------+-------+----------------+-------------------+ [id number]            Multiphasic, WNL                    +----------+--------+-------+----------------+-------------------+ +---------+--------+--+--------+--+---------+ VertebralPSV cm/s42EDV cm/s12Antegrade +---------+--------+--+--------+--+---------+  Left Carotid Findings: +----------+--------+--------+--------+--------------------------+--------+           PSV cm/sEDV cm/sStenosisPlaque Description        Comments +----------+--------+--------+--------+--------------------------+--------+ CCA Prox  127     17                                                 +----------+--------+--------+--------+--------------------------+--------+ CCA Distal84      32              smooth and heterogenous            +----------+--------+--------+--------+--------------------------+--------+ ICA Prox  147     68              irregular and heterogenous         +----------+--------+--------+--------+--------------------------+--------+ ICA Distal98      42                                                 +----------+--------+--------+--------+--------------------------+--------+ ECA       87      17              heterogenous and smooth            +----------+--------+--------+--------+--------------------------+--------+ +----------+--------+--------+----------------+-------------------+           PSV cm/sEDV cm/sDescribe        Arm Pressure (mmHG) +----------+--------+--------+----------------+-------------------+ [id number]  Multiphasic, WNL                    +----------+--------+--------+----------------+-------------------+ +---------+--------+--+--------+-+---------+ VertebralPSV cm/s39EDV cm/s7Antegrade  +---------+--------+--+--------+-+---------+  Summary: Right Carotid: Velocities in the right ICA are consistent with a 1-39% stenosis. Left Carotid: Velocities in the left ICA are consistent with a 40-59% stenosis. Vertebrals:  Bilateral vertebral arteries demonstrate antegrade flow. Subclavians: Normal flow hemodynamics were seen in bilateral subclavian              arteries. *See table(s) above for measurements and observations.  Electronically signed by Fabienne Bruns MD on 06/29/2019 at 4:41:40 PM.    Final      Scheduled Meds:   . aspirin EC  81 mg Oral Daily  . clopidogrel  75 mg Oral Daily  . collagenase  1 application Topical See admin instructions  . enoxaparin (LOVENOX) injection  40 mg Subcutaneous Q24H  . simvastatin  20 mg Oral QHS  . sodium chloride flush  3 mL Intravenous Q12H    Continuous Infusions:   . sodium chloride    .  ceFAZolin (ANCEF) IV 2 g (06/29/19 1609)  . lactated ringers 75 mL/hr at 06/29/19 0429  . vancomycin 1,000 mg (06/29/19 0911)     LOS: 2 days     Marcellus Scott, MD, [place], [facility]. [facility]    To contact the attending provider between 7A-7P or the covering provider during after hours 7P-7A, please log into the web site [email or web address] and access using universal [place] password for that web site. If you do not have the password, please call the hospital operator.  06/29/2019, 5:44 PM

## 2019-06-29 NOTE — Progress Notes (Signed)
Noted new order for PT.  Per nursing there have been no changes since OT and PT's evaluations.   Pt was approaching baseline functioning and has no further OT or PT needs at this time.  Will cancel this new PT evaluation at this time. 06/29/2019  Jacinto Halim., PT Acute Rehabilitation Services [phone]  (pager) [phone]  (office)

## 2019-06-29 NOTE — Consult Note (Signed)
Neurology Consultation  Reason for Consult: Altered mental status Referring Physician: Elease Etienne, MD  CC: Altered mental status  History is obtained from: Chart and patient  HPI: Lauren Brown is a 59 y.o. female with history of eczema, peripheral vascular disease, hypertension, hyperlipidemia.  Patient has also had a left iliac artery stent placed on 06/24/2019 at [facility]. At baseline patient is usually alert and oriented however has been having a worsening mental status over the last 2 to 3 days.  Neurology was asked see patient for altered mental status.  Apparently was noted to be sleepy all day on 06/27/2019.  On the following day she was noted to be confused, slurring speech and babbling incoherently.CT of head was negative and patient was transferred to [facility] for further evaluation by neurology.  Currently ID has been consulted in addition due to 1 set of blood cultures showing positive for staph aureus.  At this point there is question of sepsis which may be multifactorial and possible osteomyelitis and/or CNS infection.  She has been started on acyclovir, ampicillin, Rocephin along with vancomycin.  MRI has been obtained showing a 7 mm focus of restricted diffusion involving the central aspect of the splenium of the brain favored to reflect a small acute ischemic infarct.  LP was attempted in the ED however due to agitation unable to obtain and will be done under fluoroscopy.  Currently on consultation patient is alert and oriented.  She knows that she came in secondary to having altered mental status but does not recall that period of time.  After receiving antibiotics patient is now back to her baseline.     Past Medical History:  Diagnosis Date  . Eczema   . Hyperlipidemia   . Hypertension   . Peripheral vascular disease (HCC)    Family History  Problem Relation Age of Onset  . Hypertension Mother   . Hypertension Father    Social History:   reports that  she has been smoking. She has never used smokeless tobacco. She reports that she does not drink alcohol or use drugs.  Medications  Current Facility-Administered Medications:  .  0.9 %  sodium chloride infusion, 250 mL, Intravenous, PRN, Olga Coaster, MD .  acetaminophen (TYLENOL) tablet 650 mg, 650 mg, Oral, Q6H PRN, 650 mg at 06/28/19 1855 **OR** acetaminophen (TYLENOL) suppository 650 mg, 650 mg, Rectal, Q6H PRN, Olga Coaster, MD .  acyclovir (ZOVIRAX) 800 mg in dextrose 5 % 150 mL IVPB, 800 mg, Intravenous, Q8H, Coffee, Garrett, [facility], Last Rate: 166 mL/hr at 06/29/19 0515, 800 mg at 06/29/19 0515 .  ampicillin (OMNIPEN) 2 g in sodium chloride 0.9 % 100 mL IVPB, 2 g, Intravenous, Q4H, Shah, Pratik D, DO, Last Rate: 300 mL/hr at 06/29/19 0724, 2 g at 06/29/19 0724 .  bisacodyl (DULCOLAX) suppository 10 mg, 10 mg, Rectal, Daily PRN, Olga Coaster, MD .  cefTRIAXone (ROCEPHIN) 2 g in sodium chloride 0.9 % 100 mL IVPB, 2 g, Intravenous, Q12H, Della Goo, RPH .  collagenase (SANTYL) ointment 1 application, 1 application, Topical, See admin instructions, Olga Coaster, MD .  enoxaparin (LOVENOX) injection 40 mg, 40 mg, Subcutaneous, Q24H, Olga Coaster, MD, Stopped at 06/28/19 2000 .  lactated ringers infusion, , Intravenous, Continuous, Olga Coaster, MD, Last Rate: 75 mL/hr at 06/29/19 0429, New Bag at 06/29/19 0429 .  ondansetron (ZOFRAN) tablet 4 mg, 4 mg, Oral, Q6H PRN **OR** ondansetron (ZOFRAN) injection 4 mg, 4 mg, Intravenous,  Q6H PRN, Olga Coaster, MD .  polyethylene glycol (MIRALAX / GLYCOLAX) packet 17 g, 17 g, Oral, Daily PRN, Olga Coaster, MD .  simvastatin (ZOCOR) tablet 20 mg, 20 mg, Oral, QHS, Olga Coaster, MD, 20 mg at 06/28/19 2156 .  sodium chloride flush (NS) 0.9 % injection 3 mL, 3 mL, Intravenous, Q12H, Olga Coaster, MD, 3 mL at 06/28/19 2200 .  sodium chloride flush (NS) 0.9 % injection 3 mL, 3 mL, Intravenous, PRN, Olga Coaster, MD .  vancomycin (VANCOCIN) IVPB 1000 mg/200 mL premix, 1,000 mg, Intravenous, Q12H, Olga Coaster, MD, Last Rate: 200 mL/hr at 06/29/19 0911, 1,000 mg at 06/29/19 0911  ROS:   General ROS: negative for - chills, fatigue, fever, night sweats, weight gain or weight loss Psychological ROS: negative for - behavioral disorder, hallucinations, memory difficulties, mood swings or suicidal ideation Ophthalmic ROS: negative for - blurry vision, double vision, eye pain or loss of vision ENT ROS: negative for - epistaxis, nasal discharge, oral lesions, sore throat, tinnitus or vertigo Allergy and Immunology ROS: negative for - hives or itchy/watery eyes Hematological and Lymphatic ROS: negative for - bleeding problems, bruising or swollen lymph nodes Endocrine ROS: negative for - galactorrhea, hair pattern changes, polydipsia/polyuria or temperature intolerance Respiratory ROS: negative for - cough, hemoptysis, shortness of breath or wheezing Cardiovascular ROS: negative for - chest pain, dyspnea on exertion, edema or irregular heartbeat Gastrointestinal ROS: negative for - abdominal pain, diarrhea, hematemesis, nausea/vomiting or stool incontinence Genito-Urinary ROS: negative for - dysuria, hematuria, incontinence or urinary frequency/urgency Musculoskeletal ROS: negative for - joint swelling or muscular weakness Neurological ROS: as noted in HPI Dermatological ROS: negative for rash and skin lesion changes  Exam: Current vital signs: BP 123/69 (BP Location: Left Arm)   Pulse 60   Temp 98.8 F (37.1 C)   Resp 20   Ht 5\' 7"  (1.702 m)   Wt 82.5 kg   SpO2 98%   BMI 28.49 kg/m  Vital signs in last 24 hours: Temp:  [98.5 F (36.9 C)-101 F (38.3 C)] 98.8 F (37.1 C) (01/04 0910) Pulse Rate:  [60-88] 60 (01/04 0910) Resp:  [14-27] 20 (01/04 0910) BP: (108-137)/(58-78) 123/69 (01/04 0910) SpO2:  [95 %-100 %] 98 % (01/04 0910) Weight:  [82.5 kg] 82.5 kg (01/04  0435)   Constitutional: Appears well-developed and well-nourished.  Psych: Affect appropriate to situation Eyes: No scleral injection HENT: No OP obstrucion Head: Normocephalic.  Cardiovascular: Normal rate and regular rhythm.  Respiratory: Effort normal, non-labored breathing GI: Soft.  No distension. There is no tenderness.  Skin: WDI  Neuro: Mental Status: Patient is awake, alert, oriented to person, place, month, year, and situation. Speech-intact for naming, repeating and comprehension Patient is able to give a clear and coherent history. Cranial Nerves: II: Visual Fields are full.  III,IV, VI: EOMI without ptosis or diploplia. Pupils equal, round and reactive to light V: Facial sensation is symmetric to temperature VII: Facial movement is symmetric.  VIII: hearing is intact to voice X: Palat elevates symmetrically XI: Shoulder shrug is symmetric. XII: tongue is midline without atrophy or fasciculations.  Motor: Tone is normal. Bulk is normal. 5/5 strength was present in all four extremities.  No drift No aterixis Sensory: Sensation is symmetric to light touch and temperature in the arms and legs. Deep Tendon Reflexes: 2+ and symmetric in the biceps and patellae.  Plantars: Toes are downgoing bilaterally.  Cerebellar: FNF and HKS are intact bilaterally  Labs  I have reviewed labs in epic and the results pertinent to this consultation are:   CBC    Component Value Date/Time   WBC 11.8 (H) 06/28/2019 1025   RBC 4.51 06/28/2019 1025   HGB 12.1 06/28/2019 1025   HCT 37.9 06/28/2019 1025   PLT 202 06/28/2019 1025   MCV 84.0 06/28/2019 1025   MCH 26.8 06/28/2019 1025   MCHC 31.9 06/28/2019 1025   RDW 13.0 06/28/2019 1025   LYMPHSABS 1.1 06/27/2019 1802   MONOABS 0.7 06/27/2019 1802   EOSABS 0.0 06/27/2019 1802   BASOSABS 0.0 06/27/2019 1802    CMP     Component Value Date/Time   NA 130 (L) 06/28/2019 1025   K 3.2 (L) 06/28/2019 1025   CL 98 06/28/2019  1025   CO2 22 06/28/2019 1025   GLUCOSE 233 (H) 06/28/2019 1025   BUN 15 06/28/2019 1025   CREATININE 0.84 06/28/2019 1025   CALCIUM 8.2 (L) 06/28/2019 1025   PROT 7.0 06/28/2019 1025   ALBUMIN 3.1 (L) 06/28/2019 1025   AST 32 06/28/2019 1025   ALT 20 06/28/2019 1025   ALKPHOS 71 06/28/2019 1025   BILITOT 0.9 06/28/2019 1025   GFRNONAA >60 06/28/2019 1025   GFRAA >60 06/28/2019 1025   Blood culture positive for staph aureus  Imaging I have reviewed the images obtained:  CT-scan of the brain-no acute intracranial abnormality noted  MRI examination of the brain-7 mm focus of restricted diffusion involving the central aspect of the splenium.  By radiologist favored to reflect small acute ischemic infarct.  No other acute intracranial processes identified  Etta Quill PA-C [facility] [phone]  M-F  (9:00 am- 5:00 PM)  06/29/2019, 9:33 AM     Assessment:  59 year old female patient brought to [facility] secondary to altered mental status.   Work-up revealed sepsis secondary to Staphylococcus bacteremia currently on antibiotics.  Patient is now back to her baseline with no confusion.  During work-up however it was noted that she had a 7 mm focus of restricted diffusion reflecting a small infarct.  At this point in time feel infarct is likely small vessel infarct and less likely embolic.  I have talked with ID who does not believe patient needs lumbar puncture as patient has significantly improved. Agree with this.  As noted on exam patient is alert and oriented and back to baseline as far as mentation.  Most likely cause of confusion is sepsis in the setting of bacteremia as she has improved significantly from antibiotics  Impression: -Altered mental status/improved -Punctate infarct- incidental, secondary to small vessel disease.  Less likely septic emboli -Sepsis/Staphylococcus aureus bacteremia which is now being treated  Recommend #MRA Head and neck   #Transthoracic Echo #Given this infarct looks most likely to be small vessel ischemic infarct and less likely to be a septic emboli (with TTE negative for endocarditis) along with the fact that patient just received an iliac stent on 06/14/2019 patient should be restarted on both her antiplatelets as benefit greater than risk #Start Atorvastatin 40 mg/other high intensity statin # BP goal: gradual normotension # HBAIC and Lipid profile # Telemetry monitoring # Frequent neuro checks # NPO until passes stroke swallow screen #Continue with antibiotics per ID # Agree with ID given improvement and supple neck do not see need to obtain LP   # please page stroke NP  Or  PA  Or MD from 8am -4 pm  as this patient from this time will be  followed  by the stroke.   You can look them up on [email or web address]  Password [email or web address]   NEUROHOSPITALIST ADDENDUM Performed a face to face diagnostic evaluation.   I have reviewed the contents of history and physical exam as documented by PA/ARNP/Resident and agree with above documentation.  I have discussed and formulated the above plan as documented. Edits to the note have been made as needed.  Neurology was consulted MRI brain performed for altered mental status showed 7 mm restriction diffusion in the central aspect of the splenium thought to be small acute ischemic infarct. Likely small vessel disease, less likely to be septic emboli/vasospasm. Patient currently on antibiotics and TTE negative for obvious endocarditis, therefore I think resuming antiplatelet therapy given recent iliac stent outweighs hemorrhagic risk. Recommend vascular imaging for completion of stroke work-up along with A1c lipid profile.  AMS likely in the setting of bacteremia, neck is supple and patient's mental status is back to normal, we can therefore defer LP (agree with ID).    Follow-up on MR angiogram, Vas [place], Aic and lipid profile.  Continue to treat infection    Lauren Shonk  MD [facility] [phone]   If 7pm to 7am, please call on call as listed on AMION.

## 2019-06-29 NOTE — Consult Note (Signed)
[facility]    Date of Admission:  06/27/2019    Day 2 vancomycin        Day 2 ceftriaxone        Day 2 ampicillin        Day 2 acyclovir       Reason for Consult: Fever and altered mental status    Referring Provider: Dr. Marcellus Scott  Assessment: She has aureus bacteremia most chronically coming from her chronic left leg wound.  We will continue IV vancomycin and start cefazolin pending final antibiotic susceptibility results.  I will stop the broad empiric therapy for possible meningoencephalitis.  Plan: 1. Continue vancomycin 2. Start cefazolin 3. Discontinue ceftriaxone, ampicillin and acyclovir 4. Repeat blood cultures (hold off on PICC placement for now) 5. TTE  Principal Problem:   Bacteremia due to Staphylococcus aureus Active Problems:   AMS (altered mental status)   Leg wound, left   PVD (peripheral vascular disease) (HCC)   Essential hypertension   Hyperlipidemia   Altered mental status   Hyperglycemia   Scheduled Meds: . collagenase  1 application Topical See admin instructions  . enoxaparin (LOVENOX) injection  40 mg Subcutaneous Q24H  . simvastatin  20 mg Oral QHS  . sodium chloride flush  3 mL Intravenous Q12H   Continuous Infusions: . sodium chloride    . acyclovir 800 mg (06/29/19 0515)  . ampicillin (OMNIPEN) IV 2 g (06/29/19 0724)  . cefTRIAXone (ROCEPHIN)  IV    . lactated ringers 75 mL/hr at 06/29/19 0429  . vancomycin 1,000 mg (06/29/19 0911)   PRN Meds:.sodium chloride, acetaminophen **OR** acetaminophen, bisacodyl, ondansetron **OR** ondansetron (ZOFRAN) IV, polyethylene glycol, sodium chloride flush  HPI: Lauren Brown is a 59 y.o. female with peripheral vascular disease and recent limb threatening ischemia.  She underwent left iliac artery stenting on 06/24/2019.  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  Cultures of the wound last October grew MSSA.  She  recalls that her wound was starting to get a little bit better after her recent stenting but said that she still had pink, bloody drainage with a foul odor.  She became acutely confused taken to the [facility] emergency department 2 days ago where she was noted to have a temperature of 101 degrees.  Testing for COVID-19 was negative.  Because of concerns for acute meningoencephalitis she was started on broad empiric antimicrobial therapy.  Notes indicate that her confusion cleared rapidly.  She was transferred here last evening for possible lumbar puncture and evaluation by neurology.  1 blood culture was obtained upon admission and it is growing staph aureus with susceptibilities pending.  She is now afebrile and is feeling much better.  MRI of her left leg showed the soft tissue defect associated with her chronic wound but no underlying osteomyelitis or abscess.   Review of Systems: Review of Systems  Constitutional: Negative for chills, diaphoresis and fever.  HENT: Negative for congestion and sore throat.   Respiratory: Negative for cough and shortness of breath.   Cardiovascular: Negative for chest pain.  Gastrointestinal: Negative for abdominal pain, diarrhea, nausea and vomiting.  Genitourinary: Negative for dysuria.  Musculoskeletal: Negative for back pain.  Skin: Positive for rash.  Neurological: Negative for headaches.    Past Medical History:  Diagnosis Date  . Eczema   . Hyperlipidemia   . Hypertension   . Peripheral  vascular disease (HCC)     Social History   Tobacco Use  . Smoking status: Current Every Day Smoker  . Smokeless tobacco: Never Used  Substance Use Topics  . Alcohol use: Never  . Drug use: Never    Family History  Problem Relation Age of Onset  . Hypertension Mother   . Hypertension Father    No Known Allergies  OBJECTIVE: Blood pressure 123/69, pulse 60, temperature 98.8 F (37.1 C), resp. rate 20, height 5\' 7"  (1.702 m), weight 82.5 kg, SpO2 98  %.  Physical Exam Constitutional:      Comments: She is alert and appears comfortable.  She is resting quietly in bed watching television.  Eyes:     Conjunctiva/sclera: Conjunctivae normal.  Cardiovascular:     Rate and Rhythm: Normal rate and regular rhythm.     Heart sounds: No murmur.  Pulmonary:     Effort: Pulmonary effort is normal.     Breath sounds: Normal breath sounds.  Abdominal:     Palpations: Abdomen is soft.     Tenderness: There is no abdominal tenderness.  Musculoskeletal:     Cervical back: Neck supple.  Skin:    Comments: She is wearing nail polish.  She has dark, thickened and fissured skin on her hands elbows and feet.  She has a history of eczema.  Neurological:     General: No focal deficit present.  Psychiatric:        Mood and Affect: Mood normal.     Lab Results Lab Results  Component Value Date   WBC 7.8 06/29/2019   HGB 11.9 (L) 06/29/2019   HCT 36.5 06/29/2019   MCV 81.7 06/29/2019   PLT 202 06/29/2019    Lab Results  Component Value Date   CREATININE 0.84 06/28/2019   BUN 15 06/28/2019   NA 130 (L) 06/28/2019   K 3.2 (L) 06/28/2019   CL 98 06/28/2019   CO2 22 06/28/2019    Lab Results  Component Value Date   ALT 20 06/28/2019   AST 32 06/28/2019   ALKPHOS 71 06/28/2019   BILITOT 0.9 06/28/2019     Microbiology: Recent Results (from the past 240 hour(s))  Urine culture     Status: None (Preliminary result)   Collection Time: 06/27/19  5:33 PM   Specimen: In/Out Cath Urine  Result Value Ref Range Status   Specimen Description   Final    IN/OUT CATH URINE Performed at [facility], [address], [place], [place]    Special Requests   Final    NONE Performed at [facility], [address] [place], [place]    Culture   Final    CULTURE REINCUBATED FOR BETTER GROWTH Performed at [facility], [address] [place], [place]    Report Status PENDING  Incomplete  Blood Culture  (routine x 2)     Status: Abnormal (Preliminary result)   Collection Time: 06/27/19  6:02 PM   Specimen: Right Antecubital; Blood  Result Value Ref Range Status   Specimen Description   Final    RIGHT ANTECUBITAL Performed at [facility], [address] [place], [place]    Special Requests   Final    BOTTLES DRAWN AEROBIC AND ANAEROBIC Blood Culture adequate volume Performed at [facility], [address], [place], [place]    Culture  Setup Time   Final    GRAM POSITIVE COCCI AEROBIC BOTTLE ONLY Gram  Stain Report Called to,Read Back By and Verified With: RACHAEL H.,RN @0742  06/28/2019 KAY Performed at [facility], [address] [place], [place] [address]    Culture STAPHYLOCOCCUS AUREUS (A)  Final   Report Status PENDING  Incomplete  SARS CORONAVIRUS 2 (TAT 6-24 HRS) Nasopharyngeal Nasopharyngeal Swab     Status: None   Collection Time: 06/27/19  7:57 PM   Specimen: Nasopharyngeal Swab  Result Value Ref Range Status   SARS Coronavirus 2 NEGATIVE NEGATIVE Final    Comment: (NOTE) SARS-CoV-2 target nucleic acids are NOT DETECTED. The SARS-CoV-2 RNA is generally detectable in upper and lower respiratory specimens during the acute phase of infection. Negative results do not preclude SARS-CoV-2 infection, do not rule out co-infections with other pathogens, and should not be used as the sole basis for treatment or other patient management decisions. Negative results must be combined with clinical observations, patient history, and epidemiological information. The expected result is Negative. Fact Sheet for Patients: SugarRoll.be Fact Sheet for Healthcare Providers: https://www.woods-mathews.com/ This test is not yet approved or cleared by the [place] FDA and  has been authorized for detection and/or diagnosis of SARS-CoV-2 by FDA under an Emergency Use Authorization (EUA). This EUA will remain  in effect (meaning  this test can be used) for the duration of the COVID-19 declaration under Section 56 4(b)(1) of the Act, 21 U.S.C. section 360bbb-3(b)(1), unless the authorization is terminated or revoked sooner. Performed at [facility], [address], [place], [place] [address]     Michel Bickers, [facility] [phone] pager   [phone] cell 06/29/2019, 9:45 AM

## 2019-06-29 NOTE — Progress Notes (Addendum)
Physical Therapy Treatment Patient Details Name: Lauren Brown MRN: [id number] DOB: 1960/07/27 Today's Date: 06/29/2019    History of Present Illness Pt is a 59 yo female s/p AMS resulting in MRI brain:  restricted diffusion involving of the splenium of the splenium of the brain, favored to reflect a small acute ischemic infarct.    PT Comments    Pt is at or close to baseline functioning and should be safe at home. There are no further acute PT needs.  Will sign off at this time.    Follow Up Recommendations  No PT follow up     Equipment Recommendations  None recommended by PT    Recommendations for Other Services       Precautions / Restrictions Precautions Precautions: Fall Restrictions Weight Bearing Restrictions: No    Mobility  Bed Mobility Overal bed mobility: Modified Independent                Transfers Overall transfer level: Modified independent                  Ambulation/Gait Ambulation/Gait assistance: Modified independent (Device/Increase time) Gait Distance (Feet): 300 Feet Assistive device: None Gait Pattern/deviations: Step-through pattern Gait velocity: moderate, can speed up significantly to cuing. Gait velocity interpretation: >2.62 ft/sec, indicative of community ambulatory General Gait Details: generally steady with 1-2 self recoverable deviations.   Stairs Stairs: Yes Stairs assistance: Modified independent (Device/Increase time) Stair Management: One rail Left;Alternating pattern;Forwards Number of Stairs: 3 General stair comments: limited by lines, safe with rail   Wheelchair Mobility    Modified Rankin (Stroke Patients Only)       Balance Overall balance assessment: No apparent balance deficits (not formally assessed)                                          Cognition Arousal/Alertness: Awake/alert Behavior During Therapy: WFL for tasks assessed/performed Overall Cognitive Status: Within  Functional Limits for tasks assessed                                        Exercises      General Comments General comments (skin integrity, edema, etc.): VSS. 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, driving; spouse does not work; mother is home too   PT Goals (current goals can now be found in the care plan section) Acute Rehab PT Goals Patient Stated Goal: to go home PT Goal Formulation: All assessment and education complete, DC therapy    Frequency           PT Plan      Co-evaluation PT/OT/SLP Co-Evaluation/Treatment: Yes Reason for Co-Treatment: For patient/therapist safety PT goals addressed during session: Mobility/safety with mobility OT goals addressed during session: ADL's and self-care      AM-PAC PT "6 Clicks" Mobility  Outcome Measure  Help needed turning from your back to your side while in a flat bed without using bedrails?: None Help needed moving from lying on your back to sitting on the side of a flat bed without using bedrails?: None Help needed moving to and from a bed to a chair (including a wheelchair)?: None Help needed standing up from a chair using your arms (e.g., wheelchair or bedside chair)?: None Help needed to walk in hospital room?: None Help needed climbing 3-5 steps with a railing? : None 6 Click Score: 24    End of Session   Activity Tolerance: Patient tolerated treatment well Patient left: in chair;with call  bell/phone within reach Nurse Communication: Mobility status PT Visit Diagnosis: Unsteadiness on feet (R26.81)     Time: 4967-5916 PT Time Calculation (min) (ACUTE ONLY): 33 min  Charges:                        06/29/2019  Jacinto Halim., PT Acute Rehabilitation Services [phone]  (pager) [phone]  (office)   Eliseo Gum Zaylyn Bergdoll 06/29/2019, 1:06 PM

## 2019-06-29 NOTE — Progress Notes (Signed)
Carotid artery duplex completed. Refer to "CV Proc" under chart review to view preliminary results.  06/29/2019 3:53 PM Eula Fried., MHA, RVT, RDCS, RDMS

## 2019-06-29 NOTE — Consult Note (Signed)
WOC Nurse Consult Note: Patient receiving care in [facility]. Primary RN present at the time of my assessment. Reason for Consult: LLE wound Wound type: unclear at this time.  The patient stated the area started a a "blister in September of last year", and has progressively worsened since that time despite the leg undergoing stent placement and her receiving care at a wound clinic in [place]. Pressure Injury POA: NA Measurement: 4.3 cm x 2.3 cm x 1.1 cm Wound bed: pink and yellow Drainage (amount, consistency, odor) Heavy amount of tan drainage on the existing telfa pad and in the wound itself. Periwound: darkened edges, otherwise intact.  There is a fissure along the posterior/medial heel in the achilles tendon area.  The patient states her shoe does not rub the area, origin unclear at this time. Dressing procedure/placement/frequency: Cleanse LLE wound along the shin with No Rinse Spray. Dab clean.  Place a small piece of Aquacel [place] [phone]) into the wound. Wrap this area and the fissure at the achilles tendon area with kerlex. Change daily.  This care was performed at the time of my visit. Monitor the wound area(s) for worsening of condition such as: Signs/symptoms of infection,  Increase in size,  Development of or worsening of odor, Development of pain, or increased pain at the affected locations.  Notify the medical team if any of these develop.  Thank you for the consult.  Discussed plan of care with the patient and bedside nurse.  WOC nurse will not follow at this time.  Please re-consult the WOC team if needed.  Helmut Muster, RN, MSN, CWOCN, CNS-BC, pager [phone]

## 2019-06-29 NOTE — Progress Notes (Signed)
PHARMACY NOTE:  ANTIMICROBIAL RENAL DOSAGE ADJUSTMENT  Current antimicrobial regimen includes a mismatch between antimicrobial dosage and estimated renal function.  As per policy approved by the Pharmacy & Therapeutics and Medical Executive Committees, the antimicrobial dosage will be adjusted accordingly.  Current antimicrobial dosage:  Ceftriaxone 2 gm every 24 hours   Indication: Wound infection / R/O CNS infection   Renal Function:  Estimated Creatinine Clearance: 80.7 mL/min (by C-G formula based on SCr of 0.84 mg/dL). []      On intermittent HD, scheduled: []      On CRRT    Antimicrobial dosage has been changed to:  Ceftriaxone 2 gm q 12 hours   Additional comments:   Thank you for allowing pharmacy to be a part of this patient's care.  , PharmD, BCPS, BCIDP Infectious Diseases Clinical Pharmacist Phone: [phone] 06/29/2019 8:56 AM

## 2019-06-30 ENCOUNTER — Inpatient Hospital Stay: Payer: Self-pay

## 2019-06-30 DIAGNOSIS — G9340 Encephalopathy, unspecified: Secondary | ICD-10-CM

## 2019-06-30 LAB — CBC
HCT: 36 % (ref 36.0–46.0)
Hemoglobin: 11.7 g/dL — ABNORMAL LOW (ref 12.0–15.0)
MCH: 26.7 pg (ref 26.0–34.0)
MCHC: 32.5 g/dL (ref 30.0–36.0)
MCV: 82.2 fL (ref 80.0–100.0)
Platelets: 253 10*3/uL (ref 150–400)
RBC: 4.38 MIL/uL (ref 3.87–5.11)
RDW: 12.8 % (ref 11.5–15.5)
WBC: 8.2 10*3/uL (ref 4.0–10.5)
nRBC: 0 % (ref 0.0–0.2)

## 2019-06-30 LAB — CULTURE, BLOOD (ROUTINE X 2): Special Requests: ADEQUATE

## 2019-06-30 LAB — BASIC METABOLIC PANEL
Anion gap: 12 (ref 5–15)
BUN: 5 mg/dL — ABNORMAL LOW (ref 6–20)
CO2: 26 mmol/L (ref 22–32)
Calcium: 8.6 mg/dL — ABNORMAL LOW (ref 8.9–10.3)
Chloride: 98 mmol/L (ref 98–111)
Creatinine, Ser: 0.63 mg/dL (ref 0.44–1.00)
GFR calc Af Amer: >60 mL/min (ref >60)
GFR calc non Af Amer: >60 mL/min (ref >60)
Glucose, Bld: 112 mg/dL — ABNORMAL HIGH (ref 70–99)
Potassium: 3.3 mmol/L — ABNORMAL LOW (ref 3.5–5.1)
Sodium: 136 mmol/L (ref 135–145)

## 2019-06-30 LAB — LIPID PANEL
Cholesterol: 116 mg/dL (ref 0–200)
HDL: 17 mg/dL — ABNORMAL LOW (ref >40)
LDL Cholesterol: 70 mg/dL (ref 0–99)
Total CHOL/HDL Ratio: 6.8 RATIO
Triglycerides: 145 mg/dL (ref <150)
VLDL: 29 mg/dL (ref 0–40)

## 2019-06-30 LAB — T4, FREE: Free T4: 1.17 ng/dL — ABNORMAL HIGH (ref 0.61–1.12)

## 2019-06-30 LAB — TSH: TSH: 2.404 u[IU]/mL (ref 0.350–4.500)

## 2019-06-30 LAB — HEMOGLOBIN A1C
Hgb A1c MFr Bld: 5.9 % — ABNORMAL HIGH (ref 4.8–5.6)
Mean Plasma Glucose: 122.63 mg/dL

## 2019-06-30 MED ORDER — VITAMIN B-12 100 MCG PO TABS
500.0000 ug | ORAL_TABLET | Freq: Every day | ORAL | Status: DC
  Administered 2019-06-30 – 2019-07-01 (×2): 500 ug via ORAL
  Filled 2019-06-30 (×2): qty 5

## 2019-06-30 MED ORDER — POTASSIUM CHLORIDE CRYS ER 20 MEQ PO TBCR
40.0000 meq | EXTENDED_RELEASE_TABLET | Freq: Once | ORAL | Status: AC
  Administered 2019-06-30: 40 meq via ORAL
  Filled 2019-06-30: qty 2

## 2019-06-30 NOTE — Progress Notes (Signed)
PROGRESS NOTE   Lauren Brown  ZSW:[id number]    DOB: 05/27/1961    DOA: 06/27/2019  PCP: Patient, No Pcp Per   I have briefly reviewed patients previous medical records in [facility].  Chief Complaint:   Chief Complaint  Patient presents with  . Altered Mental Status    Brief Narrative:  59 year old female with PMH of peripheral artery disease, hypertension, hyperlipidemia, recent limb threatening ischemia, s/p left iliac artery stenting on 06/24/2019, followed at the wound care center for chronic left lower leg wound, cultures from 03/2019 showed MSSA, wound reportedly was getting better after stenting but still had pink, bloody drainage with a foul odor, initially presented to [facility] on 06/26/2018 due to acute confusion and fever of 101 F.  COVID-19 test was negative.  There was concern regarding acute meningoencephalitis for which she was started on broad-spectrum antimicrobials and transferred to [facility] on 1/3 for possible lumbar puncture and neurology evaluation.  Neurology and ID consulted.  Mental status changes have resolved.  Currently with working diagnosis of transient acute metabolic encephalopathy due to staphylococcal bacteremia from infected chronic left leg wound.  ID recommends TEE to rule out endocarditis, Cardiology consulted.   Assessment & Plan:  Principal Problem:   Bacteremia due to methicillin susceptible Staphylococcus aureus (MSSA) Active Problems:   PVD (peripheral vascular disease) (HCC)   AMS (altered mental status)   Essential hypertension   Hyperlipidemia   Altered mental status   Leg wound, left   Hyperglycemia   Cerebral thrombosis with cerebral infarction   Sepsis due to MSSA bacteremia, suspected due to infected chronic left leg wound  There was initial concern for meningoencephalitis and patient was started on broad-spectrum antimicrobials and transferred from [facility] to [facility] for LP,  Neurology and ID consultation.  ID follow-up appreciated.  Continuing IV cefazolin.  Discontinued IV vancomycin.  Discontinued ceftriaxone, ampicillin and acyclovir (had been empirically started for meningoencephalitis).  Surveillance blood cultures are negative to date.  Hold off on PICC line placement until these blood cultures are negative for at least 48 hours.  TTE negative for vegetations but suboptimal study and hence ID recommends Cardiology consultation for TEE.  Urine culture shows Staph epidermidis,?  Significance.  No UTI symptoms.  Acute ischemic stroke  MRI brain: Several millimeters focus of restricted diffusion involving the central aspect of the splenium of the brain, favored to reflect a small acute ischemic infarct.  No hemorrhage or mass.  MRA brain: Unremarkable.  TTE: LVEF 60-65%.  No obvious valvular vegetation  Carotid Dopplers: Right ICA 1-39% stenosis.  Left ICA 40-59% stenosis.  Bilateral vertebral arteries with antegrade flow.  Neurology consultation appreciated.  Suspect acute stroke related to small vessel disease.  Okay with resuming aspirin and Plavix given recent iliac stent.  LDL 70.  A1c-results not found, added to labs today.  Stroke MD follow-up appreciated.  I discussed with Dr. Pearlean Brownie.  He indicates that the finding on MRI is nonspecific, noted in conditions such as sepsis or low B12.  He does not recommend any further stroke work-up at this time but may consider repeating MRI in 1 to 2 weeks after antibiotic therapy as these findings often tend to be transient.  Infected chronic left leg wound  MR of left leg: Soft tissue defect/ulceration involving anterior subcutaneous soft tissues overlying the distal tibial diaphysis with surrounding soft tissue edema and skin thickening suggestive of cellulitis.  No drainable fluid.  No osteomyelitis.  Wound care consultation.  Antibiotic management as above.  Patient reports that she follows with wound care  clinic as outpatient.  Acute metabolic encephalopathy  Most likely due to bacteremia and cellulitis.  Less likely due to acute stroke.  Low index of suspicion for meningoencephalitis, LP deferred and broad-spectrum antimicrobials discontinued.  B12: 212.  Started oral B12 supplements.  Follow B12 levels in a couple of months and if not better then consider subcutaneous or IM.  Essential hypertension  Reasonably controlled.  Peripheral artery disease  S/p left common iliac artery stent placement 12/30 at [facility] for left lower extremity limb threatening ischemia.  Aspirin and Plavix resumed.  Dyslipidemia  Continue statins.  Mild anemia  Stable  Low TSH  Clinically euthyroid.  Unclear significance or etiology.  Check free T3 and free T4, repeat TSH  Hypokalemia  Replace and follow.   DVT prophylaxis: Lovenox Code Status: Full Family Communication: None at bedside Disposition: DC home pending clinical improvement and ID clearance.   Consultants:   Neurology Infectious disease Cardiology consulted for TEE.  Procedures:   None  Antimicrobials:   IV cefazolin.  Rest discontinued   Subjective:  Patient anxious to go home.  Wondering why she is still in the hospital despite explaining to her regarding seriousness of her condition including severe bloodstream infection leading to altered mental status on admission.  She keeps saying that her confusion was related to "gas" inhalation but will not elaborate plan due to other factors which also she will not elaborate.  Patient was interviewed and examined along with female RN and NT in room.  Objective:   Vitals:   06/30/19 0500 06/30/19 0836 06/30/19 1141 06/30/19 1524  BP:  136/77 119/69 (!) 131/94  Pulse:  65 (!) 54 67  Resp:  20    Temp:  98 F (36.7 C) 98.8 F (37.1 C)   TempSrc:      SpO2:  99% 100%   Weight: 82.9 kg     Height:        General exam: Pleasant young female, moderately built and  overweight sitting up comfortably in bed. Respiratory system: Clear to auscultation. Respiratory effort normal. Cardiovascular system: S1 & S2 heard, RRR. No JVD, murmurs, rubs, gallops or clicks. No pedal edema.  Telemetry personally reviewed: SB in the 50s-SR. Gastrointestinal system: Abdomen is nondistended, soft and nontender. No organomegaly or masses felt. Normal bowel sounds heard. Central nervous system: Alert and oriented. No focal neurological deficits. Extremities: Symmetric 5 x 5 power. Skin: Left leg chronic wounds as noted in picture below taken on 1/5 appear clean and without acute findings.  Larger wound is on the left lower leg anteriorly and small open wound is on left ankle posteriorly. Psychiatry: Judgement and insight appear somewhat impaired. Mood & affect appears somewhat paranoid, baseline unknown.         Data Reviewed:   I have personally reviewed following labs and imaging studies   CBC: Recent Labs  Lab 06/27/19 1802 06/28/19 1025 06/29/19 0911 06/30/19 0803  WBC 17.1* 11.8* 7.8 8.2  NEUTROABS 15.3*  --   --   --   HGB 12.6 12.1 11.9* 11.7*  HCT 39.8 37.9 36.5 36.0  MCV 86.1 84.0 81.7 82.2  PLT 264 202 202 253    Basic Metabolic Panel: Recent Labs  Lab 06/28/19 1025 06/29/19 0911 06/30/19 0803  NA 130* 134* 136  K 3.2* 3.6 3.3*  CL 98 99 98  CO2 22 26 26   GLUCOSE  233* 115* 112*  BUN 15 7 5*  CREATININE 0.84 0.72 0.63  CALCIUM 8.2* 8.2* 8.6*    Liver Function Tests: Recent Labs  Lab 06/27/19 1802 06/28/19 1025 06/29/19 0911  AST 18 32 33  ALT 13 20 21   ALKPHOS 82 71 66  BILITOT 1.3* 0.9 0.5  PROT 7.9 7.0 6.1*  ALBUMIN 3.5 3.1* 2.6*    CBG: No results for input(s): GLUCAP in the last 168 hours.  Microbiology Studies:   Recent Results (from the past 240 hour(s))  Urine culture     Status: Abnormal (Preliminary result)   Collection Time: 06/27/19  5:33 PM   Specimen: In/Out Cath Urine  Result Value Ref Range Status    Specimen Description   Final    IN/OUT CATH URINE Performed at [facility], [address] [place], [place] [address]    Special Requests   Final    NONE Performed at [facility], [address] [place], [place] [address]    Culture (A)  Final    >=100,000 COLONIES/mL STAPHYLOCOCCUS EPIDERMIDIS SUSCEPTIBILITIES TO FOLLOW Performed at [facility] [address], [place], [place] [address]    Report Status PENDING  Incomplete  Blood Culture (routine x 2)     Status: Abnormal   Collection Time: 06/27/19  6:02 PM   Specimen: Right Antecubital; Blood  Result Value Ref Range Status   Specimen Description   Final    RIGHT ANTECUBITAL Performed at [facility], [address] [place], [place] [address]    Special Requests   Final    BOTTLES DRAWN AEROBIC AND ANAEROBIC Blood Culture adequate volume Performed at [facility], [address] [place], [place] [address]    Culture  Setup Time   Final    GRAM POSITIVE COCCI AEROBIC BOTTLE ONLY Gram Stain Report Called to,Read Back By and Verified With: RACHAEL H.,RN @0742  06/28/2019 KAY Performed at [facility], [address] [place], [place] [address]    Culture STAPHYLOCOCCUS AUREUS (A)  Final   Report Status 06/30/2019 FINAL  Final   Organism ID, Bacteria STAPHYLOCOCCUS AUREUS  Final      Susceptibility   Staphylococcus aureus - MIC*    CIPROFLOXACIN >=8 RESISTANT Resistant     ERYTHROMYCIN >=8 RESISTANT Resistant     GENTAMICIN <=0.5 SENSITIVE Sensitive     OXACILLIN 0.5 SENSITIVE Sensitive     TETRACYCLINE <=1 SENSITIVE Sensitive     VANCOMYCIN <=0.5 SENSITIVE Sensitive     TRIMETH/SULFA <=10 SENSITIVE Sensitive     CLINDAMYCIN <=0.25 SENSITIVE Sensitive     RIFAMPIN <=0.5 SENSITIVE Sensitive     Inducible Clindamycin NEGATIVE Sensitive     * STAPHYLOCOCCUS AUREUS  SARS CORONAVIRUS 2 (TAT 6-24 HRS) Nasopharyngeal Nasopharyngeal Swab     Status: None   Collection Time: 06/27/19  7:57 PM   Specimen: Nasopharyngeal  Swab  Result Value Ref Range Status   SARS Coronavirus 2 NEGATIVE NEGATIVE Final    Comment: (NOTE) SARS-CoV-2 target nucleic acids are NOT DETECTED. The SARS-CoV-2 RNA is generally detectable in upper and lower respiratory specimens during the acute phase of infection. Negative results do not preclude SARS-CoV-2 infection, do not rule out co-infections with other pathogens, and should not be used as the sole basis for treatment or other patient management decisions. Negative results must be combined with clinical observations, patient history, and epidemiological information. The expected result is Negative. Fact Sheet for Patients: SugarRoll.be Fact Sheet for Healthcare Providers: https://www.woods-mathews.com/ This test is not  yet approved or cleared by the [place] FDA and  has been authorized for detection and/or diagnosis of SARS-CoV-2 by FDA under an Emergency Use Authorization (EUA). This EUA will remain  in effect (meaning this test can be used) for the duration of the COVID-19 declaration under Section 56 4(b)(1) of the Act, 21 U.S.C. section 360bbb-3(b)(1), unless the authorization is terminated or revoked sooner. Performed at [facility], [address] [place], [place] [address]   Culture, blood (routine x 2)     Status: None (Preliminary result)   Collection Time: 06/29/19 10:09 AM   Specimen: BLOOD  Result Value Ref Range Status   Specimen Description BLOOD RIGHT ANTECUBITAL  Final   Special Requests   Final    BOTTLES DRAWN AEROBIC ONLY Blood Culture results may not be optimal due to an inadequate volume of blood received in culture bottles   Culture   Final    NO GROWTH 1 DAY Performed at [facility], [address] [place], [place] [address]    Report Status PENDING  Incomplete  Culture, blood (routine x 2)     Status: None (Preliminary result)   Collection Time: 06/29/19 10:14 AM   Specimen: BLOOD   Result Value Ref Range Status   Specimen Description BLOOD RIGHT ANTECUBITAL  Final   Special Requests   Final    BOTTLES DRAWN AEROBIC ONLY Blood Culture results may not be optimal due to an inadequate volume of blood received in culture bottles   Culture   Final    NO GROWTH 1 DAY Performed at [facility], [address] [place], [place] [address]    Report Status PENDING  Incomplete     Radiology Studies:  MR ANGIO HEAD WO CONTRAST  Result Date: 06/29/2019 CLINICAL DATA:  Altered mental status. Cytotoxic lesion in the splenium of the corpus callosum on MRI. EXAM: MRA HEAD WITHOUT CONTRAST TECHNIQUE: Angiographic images of the Circle of Willis were obtained using MRA technique without intravenous contrast. COMPARISON:  None. FINDINGS: The study is intermittently moderately to severely motion degraded. The visualized distal vertebral arteries are patent to the basilar with the left being moderately dominant. Patent PICA and SCA origins are identified bilaterally. The basilar artery is patent with assessment for stenosis limited by motion artifact including severe focal motion through its midportion. There are left larger than right posterior communicating arteries with a fetal origin of the left PCA. Both PCAs are patent without evidence of significant proximal stenosis. The internal carotid arteries are patent from skull base to carotid termini with motion artifact limiting assessment for stenosis. ACAs and MCAs are patent without evidence of proximal branch occlusion. No aneurysm is identified. IMPRESSION: Motion degraded examination limiting assessment for arterial stenosis. No major vessel occlusion. Electronically Signed   By: Sebastian AcheAllen  Grady M.D.   On: 06/29/2019 21:32   US EKG SITE RITE  Result Date: 06/30/2019 If Site Rite image not attached, placement could not be confirmed due to current cardiac rhythm.    Scheduled Meds:   . aspirin EC  81 mg Oral Daily  . clopidogrel  75  mg Oral Daily  . collagenase  1 application Topical See admin instructions  . enoxaparin (LOVENOX) injection  40 mg Subcutaneous Q24H  . simvastatin  20 mg Oral QHS  . sodium chloride flush  3 mL Intravenous Q12H    Continuous Infusions:   . sodium chloride    .  ceFAZolin (ANCEF) IV 2  g (06/30/19 1335)     LOS: 3 days     Marcellus Scott, MD, [place]. [facility]    To contact the attending provider between 7A-7P or the covering provider during after hours 7P-7A, please log into the web site [email or web address] and access using universal [place] password for that web site. If you do not have the password, please call the hospital operator.  06/30/2019, 5:11 PM

## 2019-06-30 NOTE — Progress Notes (Signed)
Patient ID: Lauren Brown, female   DOB: 1961/02/02, 59 y.o.   MRN: [id number]         [facility] for Infectious Disease  Date of Admission:  06/27/2019    Total days of antibiotics 4        Day 2 cefazolin         ASSESSMENT: She had transient encephalopathy secondary to her MSSA bacteremia.  Suspect that her chronic left leg wound is the source.  There was no clear evidence of endocarditis on TTE but the study was suboptimal.  Given the concern about possible ischemic stroke I would suggest proceeding with TEE to help determine optimal duration of IV cefazolin.  Repeat blood cultures remain negative at 24 hours.  I would like them to be negative for at least 48 hours before PICC placement.  PLAN: 1. Continue cefazolin 2. Recommend TEE 3. Await results of repeat blood cultures  Principal Problem:   Bacteremia due to methicillin susceptible Staphylococcus aureus (MSSA) Active Problems:   AMS (altered mental status)   Leg wound, left   PVD (peripheral vascular disease) (HCC)   Essential hypertension   Hyperlipidemia   Altered mental status   Hyperglycemia   Cerebral thrombosis with cerebral infarction   Scheduled Meds: . aspirin EC  81 mg Oral Daily  . clopidogrel  75 mg Oral Daily  . collagenase  1 application Topical See admin instructions  . enoxaparin (LOVENOX) injection  40 mg Subcutaneous Q24H  . simvastatin  20 mg Oral QHS  . sodium chloride flush  3 mL Intravenous Q12H   Continuous Infusions: . sodium chloride    .  ceFAZolin (ANCEF) IV 2 g (06/30/19 0658)   PRN Meds:.sodium chloride, acetaminophen **OR** acetaminophen, bisacodyl, ondansetron **OR** ondansetron (ZOFRAN) IV, polyethylene glycol, sodium chloride flush   SUBJECTIVE: She is feeling much better.  She is eager to go home.  Review of Systems: Review of Systems  Constitutional: Negative for fever and weight loss.  Respiratory: Negative for cough and shortness of breath.   Cardiovascular:  Negative for chest pain.  Gastrointestinal: Negative for abdominal pain, diarrhea, nausea and vomiting.    No Known Allergies  OBJECTIVE: Vitals:   06/30/19 0325 06/30/19 0500 06/30/19 0836 06/30/19 1141  BP:   136/77 119/69  Pulse:   65 (!) 54  Resp:   20   Temp: 99.4 F (37.4 C)  98 F (36.7 C) 98.8 F (37.1 C)  TempSrc: Oral     SpO2:   99% 100%  Weight:  82.9 kg    Height:       Body mass index is 28.62 kg/m.  Physical Exam Constitutional:      Comments: She appears alert and comfortable sitting up in bed.  Cardiovascular:     Rate and Rhythm: Normal rate and regular rhythm.     Heart sounds: No murmur.  Pulmonary:     Effort: Pulmonary effort is normal.     Breath sounds: Normal breath sounds.  Psychiatric:        Mood and Affect: Mood normal.       Lab Results Lab Results  Component Value Date   WBC 8.2 06/30/2019   HGB 11.7 (L) 06/30/2019   HCT 36.0 06/30/2019   MCV 82.2 06/30/2019   PLT 253 06/30/2019    Lab Results  Component Value Date   CREATININE 0.63 06/30/2019   BUN 5 (L) 06/30/2019   NA 136 06/30/2019   K 3.3 (L) 06/30/2019  CL 98 06/30/2019   CO2 26 06/30/2019    Lab Results  Component Value Date   ALT 21 06/29/2019   AST 33 06/29/2019   ALKPHOS 66 06/29/2019   BILITOT 0.5 06/29/2019     Microbiology: Recent Results (from the past 240 hour(s))  Urine culture     Status: Abnormal (Preliminary result)   Collection Time: 06/27/19  5:33 PM   Specimen: In/Out Cath Urine  Result Value Ref Range Status   Specimen Description   Final    IN/OUT CATH URINE Performed at [facility], [address], [place], [place] [address]    Special Requests   Final    NONE Performed at [facility], [address], [place], [place] [address]    Culture (A)  Final    >=100,000 COLONIES/mL STAPHYLOCOCCUS EPIDERMIDIS SUSCEPTIBILITIES TO FOLLOW Performed at [facility], [address] [place], [place] [address]    Report Status PENDING   Incomplete  Blood Culture (routine x 2)     Status: Abnormal   Collection Time: 06/27/19  6:02 PM   Specimen: Right Antecubital; Blood  Result Value Ref Range Status   Specimen Description   Final    RIGHT ANTECUBITAL Performed at [facility], [address], [place], [place] [address]    Special Requests   Final    BOTTLES DRAWN AEROBIC AND ANAEROBIC Blood Culture adequate volume Performed at [facility], [address], [place], [place] [address]    Culture  Setup Time   Final    GRAM POSITIVE COCCI AEROBIC BOTTLE ONLY Gram Stain Report Called to,Read Back By and Verified With: RACHAEL H.,RN @[phone]  06/28/2019 KAY Performed at [facility], [address], [place], [place]    Culture STAPHYLOCOCCUS AUREUS (A)  Final   Report Status 06/30/2019 FINAL  Final   Organism ID, Bacteria STAPHYLOCOCCUS AUREUS  Final      Susceptibility   Staphylococcus aureus - MIC*    CIPROFLOXACIN >=8 RESISTANT Resistant     ERYTHROMYCIN >=8 RESISTANT Resistant     GENTAMICIN <=0.5 SENSITIVE Sensitive     OXACILLIN 0.5 SENSITIVE Sensitive     TETRACYCLINE <=1 SENSITIVE Sensitive     VANCOMYCIN <=0.5 SENSITIVE Sensitive     TRIMETH/SULFA <=10 SENSITIVE Sensitive     CLINDAMYCIN <=0.25 SENSITIVE Sensitive     RIFAMPIN <=0.5 SENSITIVE Sensitive     Inducible Clindamycin NEGATIVE Sensitive     * STAPHYLOCOCCUS AUREUS  SARS CORONAVIRUS 2 (TAT 6-24 HRS) Nasopharyngeal Nasopharyngeal Swab     Status: None   Collection Time: 06/27/19  7:57 PM   Specimen: Nasopharyngeal Swab  Result Value Ref Range Status   SARS Coronavirus 2 NEGATIVE NEGATIVE Final    Comment: (NOTE) SARS-CoV-2 target nucleic acids are NOT DETECTED. The SARS-CoV-2 RNA is generally detectable in upper and lower respiratory specimens during the acute phase of infection. Negative results do not preclude SARS-CoV-2 infection, do not rule out co-infections with other pathogens, and should not be used as the sole basis for treatment  or other patient management decisions. Negative results must be combined with clinical observations, patient history, and epidemiological information. The expected result is Negative. Fact Sheet for Patients: 08/25/19 Fact Sheet for Healthcare Providers: HairSlick.no This test is not yet approved or cleared by the [employer] FDA and  has been authorized for detection and/or diagnosis of SARS-CoV-2 by FDA under an Emergency Use Authorization (EUA). This EUA will remain  in effect (meaning this test can be used)  for the duration of the COVID-19 declaration under Section 56 4(b)(1) of the Act, 21 U.S.C. section 360bbb-3(b)(1), unless the authorization is terminated or revoked sooner. Performed at [facility], [address] [place], [place] [address]   Culture, blood (routine x 2)     Status: None (Preliminary result)   Collection Time: 06/29/19 10:09 AM   Specimen: BLOOD  Result Value Ref Range Status   Specimen Description BLOOD RIGHT ANTECUBITAL  Final   Special Requests   Final    BOTTLES DRAWN AEROBIC ONLY Blood Culture results may not be optimal due to an inadequate volume of blood received in culture bottles   Culture   Final    NO GROWTH 1 DAY Performed at [facility], [address] [place], [place] [address]    Report Status PENDING  Incomplete  Culture, blood (routine x 2)     Status: None (Preliminary result)   Collection Time: 06/29/19 10:14 AM   Specimen: BLOOD  Result Value Ref Range Status   Specimen Description BLOOD RIGHT ANTECUBITAL  Final   Special Requests   Final    BOTTLES DRAWN AEROBIC ONLY Blood Culture results may not be optimal due to an inadequate volume of blood received in culture bottles   Culture   Final    NO GROWTH 1 DAY Performed at [facility], [address], [place], [place] [address]    Report Status PENDING  Incomplete    Cliffton Asters, MD [facility] for Infectious Disease [facility] [phone] pager   [phone] cell 06/30/2019, 1:03 PM

## 2019-06-30 NOTE — Progress Notes (Signed)
RN educated the patient on her medication regimen after the patient stated, "let me write down these meds for my family to know incase I don't wake up in the morning." The patient expressed concern about the medications making her feel dizzy and swelling up her legs. RN expressed that although some medications may have side-effects, the benefits generally outweigh the risks, especially in terms of the antibiotics, which are actively treating her infection. 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 RN assured the patient that she will be discharged as soon as she is medically stable enough to leave the hospital. Per the conversation, the patient appears to have a knowledge-deficit regarding her diagnosis, medications, and plan of care. RN encouraged the patient to ask her providers questions when they round in the morning.

## 2019-06-30 NOTE — Consult Note (Signed)
WOC Nurse Consult Note:  Patient receiving care in [facility] 4N06.  Consult for the same wound completed by myself yesterday. Please follow the wound care orders already in the chart. Reason for Consult: Wound type: Pressure Injury POA: Yes/No/NA Measurement: Wound bed: Drainage (amount, consistency, odor)  Periwound: Dressing procedure/placement/frequency: Monitor the wound area(s) for worsening of condition such as: Signs/symptoms of infection,  Increase in size,  Development of or worsening of odor, Development of pain, or increased pain at the affected locations.  Notify the medical team if any of these develop. WOC nurse will not follow at this time.  Please re-consult the WOC team if needed.  Helmut Muster, RN, MSN, CWOCN, CNS-BC, pager [phone]

## 2019-06-30 NOTE — Progress Notes (Signed)
STROKE TEAM PROGRESS NOTE   INTERVAL HISTORY I have reviewed history of presenting illness, electronic medical records and imaging films in PACS personally.  Patient presented with altered mental status and has chronic infected wound on her leg and staph bacteremia and after being started on cefazolin she is doing a whole lot better.  MRI scan of the brain shows a tiny punctate diffusion positive lesion in the midline of the splenium of the corpus callosum which is of unclear etiology and has been described in patients with strokes, sepsis or metabolic abnormalities like low vitamin B12 that the patient has.   Carotid ultrasound shows 40 to 59% left ICA and 1 to 39% right ICA stenosis.  LDL cholesterol is optimal at 70 mg percent.  Vitals:   06/29/19 2350 06/30/19 0325 06/30/19 0500 06/30/19 0836  BP:    136/77  Pulse:    65  Resp:    20  Temp: 99.3 F (37.4 C) 99.4 F (37.4 C)  98 F (36.7 C)  TempSrc: Oral Oral    SpO2:    99%  Weight:   82.9 kg   Height:        CBC:  Recent Labs  Lab 06/27/19 1802 06/29/19 0911 06/30/19 0803  WBC 17.1* 7.8 8.2  NEUTROABS 15.3*  --   --   HGB 12.6 11.9* 11.7*  HCT 39.8 36.5 36.0  MCV 86.1 81.7 82.2  PLT 264 202 253    Basic Metabolic Panel:  Recent Labs  Lab 06/29/19 0911 06/30/19 0803  NA 134* 136  K 3.6 3.3*  CL 99 98  CO2 26 26  GLUCOSE 115* 112*  BUN 7 5*  CREATININE 0.72 0.63  CALCIUM 8.2* 8.6*   Lipid Panel:     Component Value Date/Time   CHOL 116 06/30/2019 0803   TRIG 145 06/30/2019 0803   HDL 17 (L) 06/30/2019 0803   CHOLHDL 6.8 06/30/2019 0803   VLDL 29 06/30/2019 0803   LDLCALC 70 06/30/2019 0803   HgbA1c: No results found for: HGBA1C Urine Drug Screen: No results found for: LABOPIA, COCAINSCRNUR, LABBENZ, AMPHETMU, THCU, LABBARB  Alcohol Level No results found for: ETH  IMAGING past 48 hours MR ANGIO HEAD WO CONTRAST  Result Date: 06/29/2019 CLINICAL DATA:  Altered mental status. Cytotoxic lesion in the  splenium of the corpus callosum on MRI. EXAM: MRA HEAD WITHOUT CONTRAST TECHNIQUE: Angiographic images of the Circle of Willis were obtained using MRA technique without intravenous contrast. COMPARISON:  None. FINDINGS: The study is intermittently moderately to severely motion degraded. The visualized distal vertebral arteries are patent to the basilar with the left being moderately dominant. Patent PICA and SCA origins are identified bilaterally. The basilar artery is patent with assessment for stenosis limited by motion artifact including severe focal motion through its midportion. There are left larger than right posterior communicating arteries with a fetal origin of the left PCA. Both PCAs are patent without evidence of significant proximal stenosis. The internal carotid arteries are patent from skull base to carotid termini with motion artifact limiting assessment for stenosis. ACAs and MCAs are patent without evidence of proximal branch occlusion. No aneurysm is identified. IMPRESSION: Motion degraded examination limiting assessment for arterial stenosis. No major vessel occlusion. Electronically Signed   By: Sebastian Ache M.D.   On: 06/29/2019 21:32   MR BRAIN W WO CONTRAST  Result Date: 06/29/2019 CLINICAL DATA:  Initial evaluation for increased confusion, incoherent speech, lethargy. EXAM: MRI HEAD WITHOUT AND WITH CONTRAST TECHNIQUE:  Multiplanar, multiecho pulse sequences of the brain and surrounding structures were obtained without and with intravenous contrast. CONTRAST:  63mL GADAVIST GADOBUTROL 1 MMOL/ML IV SOLN COMPARISON:  Prior head CT from 06/27/2019. FINDINGS: Brain: Examination mildly degraded by motion artifact. Cerebral volume within normal limits for age. Mild scattered patchy T2/FLAIR hyperintensity within the periventricular deep white matter both cerebral hemispheres, most like related to mild chronic small vessel ischemic disease. There is a subtle 7 mm focus of restricted diffusion seen  involving the central aspect of the splenium (series 5, image 75). Signal abnormality is somewhat linear in nature, and favored to reflect a small acute ischemic infarct. No associated hemorrhage or mass effect. No other evidence for acute or subacute ischemia. Gray-white matter differentiation otherwise maintained. No encephalomalacia to suggest chronic cortical infarction. No foci of susceptibility artifact to suggest acute or chronic intracranial hemorrhage. No mass lesion, midline shift or mass effect. No hydrocephalus. No extra-axial fluid collection. Pituitary gland and suprasellar region within normal limits. No abnormal enhancement. Vascular: Major intracranial vascular flow voids are maintained. Skull and upper cervical spine: Craniocervical junction normal. Scattered degenerative changes noted within the upper cervical spine without high-grade stenosis. Bone marrow signal intensity within normal limits. No acute scalp soft tissue abnormality. Sinuses/Orbits: Globes and orbital soft tissues within normal limits. Paranasal sinuses are clear. No significant mastoid effusion. Inner ear structures grossly normal. Other: None. IMPRESSION: 1. 7 mm focus of restricted diffusion involving the central aspect of the splenium of the splenium of the brain, favored to reflect a small acute ischemic infarct. No associated hemorrhage or mass effect. Primary differential considerations include sequelae of seizure versus toxic metabolic derangement. 2. No other acute intracranial process identified. 3. Mild chronic microvascular ischemic disease. Electronically Signed   By: Rise Mu M.D.   On: 06/29/2019 01:56   MR TIBIA FIBULA LEFT WO CONTRAST  Result Date: 06/29/2019 CLINICAL DATA:  Limb threatening ischemia. Recent left common iliac artery stent placement. Clinical concern for osteomyelitis EXAM: MRI OF LOWER LEFT EXTREMITY WITHOUT CONTRAST TECHNIQUE: Multiplanar, multisequence MR imaging of the left tibia  and fibula was performed. No intravenous contrast was administered. COMPARISON:  X-ray 04/11/2018 FINDINGS: Bones/Joint/Cartilage No acute fracture. No malalignment. There is preservation of the fatty T1 marrow signal throughout the visualized osseous structures. No bone marrow edema or marrow replacing lesion. No cortical destruction or periostitis. No knee or tibiotalar joint effusion. Suspect horizontal tear of the lateral meniscal body (series 10, image 18) which is suboptimally characterized on large field-of-view images. Ligaments No evidence of ligamentous injury. Muscles and Tendons Normal muscle bulk and signal intensity without intramuscular edema, atrophy, or fatty infiltration. Visualized tendinous structures are intact without injury or fluid collection. Soft tissues Soft tissue defect involving the anterior subcutaneous soft tissues overlying the distal tibial diaphysis (series 25, image 18). Defect involves the superficial soft tissues with preservation of the underlying investing fascia of the anterior compartment musculature. There is surrounding soft tissue edema and skin thickening suggestive of cellulitis. No well-defined or drainable fluid collections. No deep fascial fluid. IMPRESSION: 1. Soft tissue defect/ulceration involving the anterior subcutaneous soft tissues overlying the distal tibial diaphysis with surrounding soft tissue edema and skin thickening suggestive of cellulitis. No well-defined or drainable fluid collections. 2. No evidence of osteomyelitis. 3. Suspect horizontal tear of the lateral meniscal body which is suboptimally characterized on large field-of-view images. These findings are in agreement with the preliminary report provided by Dr. Jake Samples at 1:42 a.m. on 06/29/2019 which was documented  in PACS of. Electronically Signed   By: Duanne Guess D.O.   On: 06/29/2019 08:14   VAS US CAROTID  Result Date: 06/29/2019 Carotid Arterial Duplex Study Indications:        Speech disturbance. Comparison Study:  No prior study. Performing Technologist: Gertie Fey MHA, RDMS, RVT, RDCS  Examination Guidelines: A complete evaluation includes B-mode imaging, spectral Doppler, color Doppler, and power Doppler as needed of all accessible portions of each vessel. Bilateral testing is considered an integral part of a complete examination. Limited examinations for reoccurring indications may be performed as noted.  Right Carotid Findings: +----------+-------+-------+--------+---------------------------------+--------+           PSV    EDV    StenosisPlaque Description               Comments           cm/s   cm/s                                                     +----------+-------+-------+--------+---------------------------------+--------+ CCA Prox  88     22                                                       +----------+-------+-------+--------+---------------------------------+--------+ CCA Distal81     24             irregular and heterogenous                +----------+-------+-------+--------+---------------------------------+--------+ ICA Prox  104    34             heterogenous, irregular and                                               calcific                                  +----------+-------+-------+--------+---------------------------------+--------+ ICA Distal87     29                                                       +----------+-------+-------+--------+---------------------------------+--------+ ECA       81     12             smooth and heterogenous                   +----------+-------+-------+--------+---------------------------------+--------+ +----------+--------+-------+----------------+-------------------+           PSV cm/sEDV cmsDescribe        Arm Pressure (mmHG) +----------+--------+-------+----------------+-------------------+ [id number]            Multiphasic, WNL                     +----------+--------+-------+----------------+-------------------+ +---------+--------+--+--------+--+---------+ VertebralPSV cm/s42EDV cm/s12Antegrade +---------+--------+--+--------+--+---------+  Left Carotid Findings: +----------+--------+--------+--------+--------------------------+--------+  PSV cm/sEDV cm/sStenosisPlaque Description        Comments +----------+--------+--------+--------+--------------------------+--------+ CCA Prox  127     17                                                 +----------+--------+--------+--------+--------------------------+--------+ CCA Distal84      32              smooth and heterogenous            +----------+--------+--------+--------+--------------------------+--------+ ICA Prox  147     68              irregular and heterogenous         +----------+--------+--------+--------+--------------------------+--------+ ICA Distal98      42                                                 +----------+--------+--------+--------+--------------------------+--------+ ECA       87      17              heterogenous and smooth            +----------+--------+--------+--------+--------------------------+--------+ +----------+--------+--------+----------------+-------------------+           PSV cm/sEDV cm/sDescribe        Arm Pressure (mmHG) +----------+--------+--------+----------------+-------------------+ [id number]             Multiphasic, WNL                    +----------+--------+--------+----------------+-------------------+ +---------+--------+--+--------+-+---------+ VertebralPSV cm/s39EDV cm/s7Antegrade +---------+--------+--+--------+-+---------+  Summary: Right Carotid: Velocities in the right ICA are consistent with a 1-39% stenosis. Left Carotid: Velocities in the left ICA are consistent with a 40-59% stenosis. Vertebrals:  Bilateral vertebral arteries demonstrate antegrade flow.  Subclavians: Normal flow hemodynamics were seen in bilateral subclavian              arteries. *See table(s) above for measurements and observations.  Electronically signed by Fabienne Brunsharles Fields MD on 06/29/2019 at 4:41:40 PM.    Final    US EKG SITE RITE  Result Date: 06/30/2019 If Site Rite image not attached, placement could not be confirmed due to current cardiac rhythm.   PHYSICAL EXAM Pleasant middle-aged African-American lady sitting comfortably in the bedside chair.  Not in distress. She has a chronic oozing wound on the left leg. . Afebrile. Head is nontraumatic. Neck is supple without bruit.    Cardiac exam no murmur or gallop. Lungs are clear to auscultation. Distal pulses are well felt. Neurological Exam :  Awake  Alert oriented x 3. Normal speech and language.  Mildly diminished attention, registration and recall.  No aphasia apraxia or dysarthria.Marland Kitchen.eye movements full without nystagmus.fundi were not visualized. Vision acuity and fields appear normal. Hearing is normal. Palatal movements are normal. Face symmetric. Tongue midline. Normal strength, tone, reflexes and coordination. Normal sensation. Gait deferred.  ASSESSMENT/PLAN Lauren Brown is a 59 y.o. female with history of eczema, peripheral vascular disease, hypertension, hyperlipidemia s/p L iliac stent 06/23/20 who developed worsened mental state over the past 2-3 days,    Altered mental status and mild dysarthria which appears to have resolved in the setting of staph bacteremia -stroke-like episode with corpus callosum DWI positive lesion not related to stroke but  to sepsis.MRI hyperintensities in splenium have been described in other conditions including sepsis and low B12   CT head No acute abnormality.   MRI  Punctate DWI abnormality central splenium. Small vessel disease.   MRA  Unremarkable   Carotid Doppler  L 40-59% stenosis, R ok  2D Echo EF 60-65%. No source of embolus   LDL 70  HgbA1c No results found for  requested labs within last 26280 hours.  Lovenox 40 mg sq daily for VTE prophylaxis  aspirin 81 mg daily and clopidogrel 75 mg daily prior to admission, now on aspirin 81 mg daily and clopidogrel 75 mg daily. Ok to continue at d/c from stroke standpoint  Therapy recommendations:  No therapy needs  No futher neuro needs, stroke team will signoff. No neuro follow up needed  Hypertension  Stable . BP goal normotensive  Hyperlipidemia  Home meds:  zocor 20, resumed in hospital  LDL 70  Continue statin at discharge  Other Stroke Risk Factors  Cigarette smoker, advised to stop smoking  PVD s/p L ilac stent 06/23/20  Other Active Problems  Sepsis d/t staph aureus bacteremia d/t chronic LLE wound  Infected chronic LLE wound  Acute metabolic encephalopathy  Mild anemia  Low TSH   Hospital day # 3  I have personally obtained history,examined this patient, reviewed notes, independently viewed imaging studies, participated in medical decision making and plan of care.ROS completed by me personally and pertinent positives fully documented  I have made any additions or clarifications directly to the above note.  She presented with altered mental status and mild dysarthria which appears to have improved after starting antibiotics for staph bacteremia which is the more likely cause of her presentation.  MRI showing tiny midline corpus callosal diffusion hyperintensity is unlikely to explain her presentation.  Similar corpus callosal hyperintensities have been described in a variety of conditions including sepsis and low B12 states which the patient has.  Hence and would not recommend further stroke work-up at the present time.  I would recommend continuing ongoing treatment of sepsis with antibiotics as per ID and may consider repeating MRI in 1 to 2 weeks as these hyperintensities often tend to be transient.  I do not believe spinal tap is indicated since patient has shown such rapid  improvement but TEE may be beneficial to rule out endocarditis.  Discussed with Dr. Trinna Post.  Greater than 50% time during this 35-minute visit was spent on counseling and coordination of care and discussion with care team.  Stroke team will sign off.  Kindly call for questions  Antony Contras, MD Medical Director [place] Pager: [phone] 06/30/2019 4:09 PM   To contact Stroke Continuity provider, please refer to [email or web address] After hours, contact General Neurology

## 2019-07-01 ENCOUNTER — Other Ambulatory Visit: Payer: Self-pay | Admitting: Physician Assistant

## 2019-07-01 ENCOUNTER — Inpatient Hospital Stay: Payer: Self-pay

## 2019-07-01 DIAGNOSIS — G9341 Metabolic encephalopathy: Secondary | ICD-10-CM

## 2019-07-01 DIAGNOSIS — B9561 Methicillin susceptible Staphylococcus aureus infection as the cause of diseases classified elsewhere: Secondary | ICD-10-CM

## 2019-07-01 DIAGNOSIS — R410 Disorientation, unspecified: Secondary | ICD-10-CM

## 2019-07-01 DIAGNOSIS — I10 Essential (primary) hypertension: Secondary | ICD-10-CM

## 2019-07-01 DIAGNOSIS — R7881 Bacteremia: Secondary | ICD-10-CM

## 2019-07-01 LAB — HEMOGLOBIN A1C
Hgb A1c MFr Bld: 5.8 % — ABNORMAL HIGH (ref 4.8–5.6)
Mean Plasma Glucose: 120 mg/dL

## 2019-07-01 LAB — T3, FREE: T3, Free: 2.3 pg/mL (ref 2.0–4.4)

## 2019-07-01 LAB — URINE CULTURE: Culture: >=100000 — AB

## 2019-07-01 MED ORDER — CEFAZOLIN IV (FOR PTA / DISCHARGE USE ONLY): 2.0000 g | Freq: Three times a day (TID) | INTRAVENOUS | 0 refills | Status: DC

## 2019-07-01 MED ORDER — CHLORHEXIDINE GLUCONATE CLOTH 2 % EX PADS: 6.0000 | MEDICATED_PAD | Freq: Every day | CUTANEOUS | Status: DC

## 2019-07-01 MED ORDER — SODIUM CHLORIDE 0.9% FLUSH: 10.0000 mL | Freq: Two times a day (BID) | INTRAVENOUS | Status: DC

## 2019-07-01 MED ORDER — SODIUM CHLORIDE 0.9% FLUSH: 10.0000 mL | INTRAVENOUS | Status: DC | PRN

## 2019-07-01 MED ORDER — CYANOCOBALAMIN 500 MCG PO TABS: 500.0000 ug | ORAL_TABLET | Freq: Every day | ORAL | 0 refills | Status: AC

## 2019-07-01 NOTE — Progress Notes (Signed)
SLP Cancellation Note  Patient Details Name: Lauren Brown MRN: [id number] DOB: [birth year]   Cancelled treatment:       Reason Eval/Treat Not Completed: SLP screened, no needs identified, will sign off. Pt was encouraged to notify PCP if difficulties arise once she returns to her normal routines.  Elisabeth Pigeon, CCC-SLP Speech Language Pathologist Office: [phone] Pager: [phone]  Leigh [place] 07/01/2019, 10:19 AM

## 2019-07-01 NOTE — Progress Notes (Signed)
Patient ID: Lauren Brown, female   DOB: 11-Mar-1961, 59 y.o.   MRN: [id number]         [facility]  Date of Admission:  06/27/2019    Total days of antibiotics 5        Day 3 cefazolin         ASSESSMENT: She is back to her baseline on treatment for MSSA bacteremia.  Repeat blood cultures are negative at 48 hours.  I am comfortable having a PICC placed.  Cardiology has agreed to perform an outpatient TEE next week to help determine optimal duration of IV cefazolin  PLAN: 1. Continue cefazolin 2. TEE as an outpatient next week 3. PICC placement today then discharged home  Diagnosis: Bacteremia  Culture Result: MSSA  No Known Allergies  OPAT Orders Discharge antibiotics: Per pharmacy protocol cefazolin  Duration: 4 weeks pending results of TEE End Date: 07/27/2019  [facility] Care Per Protocol:  Labs weekly while on IV antibiotics: _x_ CBC with differential _x_ BMP __ CMP __ CRP __ ESR __ Vancomycin trough __ CK  __ Please pull PIC at completion of IV antibiotics __ Please leave PIC in place until doctor has seen patient or been notified  Fax weekly labs to [phone]  Clinic Follow Up Appt: 07/14/2019   Principal Problem:   Bacteremia due to methicillin susceptible Staphylococcus aureus (MSSA) Active Problems:   AMS (altered mental status)   Leg wound, left   PVD (peripheral vascular disease) ([place])   Essential hypertension   Hyperlipidemia   Altered mental status   Hyperglycemia   Cerebral thrombosis with cerebral infarction   Scheduled Meds: . aspirin EC  81 mg Oral Daily  . clopidogrel  75 mg Oral Daily  . collagenase  1 application Topical See admin instructions  . enoxaparin (LOVENOX) injection  40 mg Subcutaneous Q24H  . simvastatin  20 mg Oral QHS  . sodium chloride flush  3 mL Intravenous Q12H  . vitamin B-12  500 mcg Oral Daily   Continuous Infusions: . sodium chloride    .  ceFAZolin (ANCEF) IV 2 g (07/01/19  0550)   PRN Meds:.sodium chloride, acetaminophen **OR** acetaminophen, bisacodyl, ondansetron **OR** ondansetron (ZOFRAN) IV, polyethylene glycol, sodium chloride flush   SUBJECTIVE: She is feeling back to normal and wants to go home.  Review of Systems: Review of Systems  Constitutional: Negative for fever and weight loss.  Respiratory: Negative for cough and shortness of breath.   Cardiovascular: Negative for chest pain.  Gastrointestinal: Negative for abdominal pain, diarrhea, nausea and vomiting.    No Known Allergies  OBJECTIVE: Vitals:   07/01/19 0005 07/01/19 0401 07/01/19 0800 07/01/19 0828  BP: 117/61 132/72  134/72  Pulse: 66 63  69  Resp:    18  Temp: 98.9 F (37.2 C) 99.6 F (37.6 C) 98.7 F (37.1 C) 98.8 F (37.1 C)  TempSrc: Oral Oral  Oral  SpO2:  98%  99%  Weight:      Height:       Body mass index is 28.62 kg/m.  Physical Exam Constitutional:      Comments: She is sitting up in bed.  Cardiovascular:     Rate and Rhythm: Normal rate and regular rhythm.     Heart sounds: No murmur.  Pulmonary:     Effort: Pulmonary effort is normal.     Breath sounds: Normal breath sounds.  Psychiatric:        Mood and Affect: Mood normal.  Lab Results Lab Results  Component Value Date   WBC 8.2 06/30/2019   HGB 11.7 (L) 06/30/2019   HCT 36.0 06/30/2019   MCV 82.2 06/30/2019   PLT 253 06/30/2019    Lab Results  Component Value Date   CREATININE 0.63 06/30/2019   BUN 5 (L) 06/30/2019   NA 136 06/30/2019   K 3.3 (L) 06/30/2019   CL 98 06/30/2019   CO2 26 06/30/2019    Lab Results  Component Value Date   ALT 21 06/29/2019   AST 33 06/29/2019   ALKPHOS 66 06/29/2019   BILITOT 0.5 06/29/2019     Microbiology: Recent Results (from the past 240 hour(s))  Urine culture     Status: Abnormal   Collection Time: 06/27/19  5:33 PM   Specimen: In/Out Cath Urine  Result Value Ref Range Status   Specimen Description   Final    IN/OUT CATH  URINE Performed at [facility], [address], [place], [place] [address]    Special Requests   Final    NONE Performed at [facility], [address], [place], [place] [address]    Culture >=100,000 COLONIES/mL STAPHYLOCOCCUS EPIDERMIDIS (A)  Final   Report Status 07/01/2019 FINAL  Final   Organism ID, Bacteria STAPHYLOCOCCUS EPIDERMIDIS (A)  Final      Susceptibility   Staphylococcus epidermidis - MIC*    CIPROFLOXACIN <=0.5 SENSITIVE Sensitive     GENTAMICIN <=0.5 SENSITIVE Sensitive     NITROFURANTOIN <=16 SENSITIVE Sensitive     OXACILLIN <=0.25 SENSITIVE Sensitive     TETRACYCLINE <=1 SENSITIVE Sensitive     VANCOMYCIN 1 SENSITIVE Sensitive     TRIMETH/SULFA 160 RESISTANT Resistant     CLINDAMYCIN <=0.25 SENSITIVE Sensitive     RIFAMPIN <=0.5 SENSITIVE Sensitive     Inducible Clindamycin NEGATIVE Sensitive     * >=100,000 COLONIES/mL STAPHYLOCOCCUS EPIDERMIDIS  Blood Culture (routine x 2)     Status: Abnormal   Collection Time: 06/27/19  6:02 PM   Specimen: Right Antecubital; Blood  Result Value Ref Range Status   Specimen Description   Final    RIGHT ANTECUBITAL Performed at [facility], [address] [place], [place] [address]    Special Requests   Final    BOTTLES DRAWN AEROBIC AND ANAEROBIC Blood Culture adequate volume Performed at [facility], [address], [place], [place] [address]    Culture  Setup Time   Final    GRAM POSITIVE COCCI AEROBIC BOTTLE ONLY Gram Stain Report Called to,Read Back By and Verified With: RACHAEL H.,RN _0  06/28/2019 KAY Performed at [facility], [address] [place],  [address]    Culture STAPHYLOCOCCUS AUREUS (A)  Final   Report Status 06/30/2019 FINAL  Final   Organism ID, Bacteria STAPHYLOCOCCUS AUREUS  Final      Susceptibility   Staphylococcus aureus - MIC*    CIPROFLOXACIN >=8 RESISTANT Resistant     ERYTHROMYCIN >=8 RESISTANT Resistant     GENTAMICIN <=0.5 SENSITIVE Sensitive     OXACILLIN 0.5 SENSITIVE  Sensitive     TETRACYCLINE <=1 SENSITIVE Sensitive     VANCOMYCIN <=0.5 SENSITIVE Sensitive     TRIMETH/SULFA <=10 SENSITIVE Sensitive     CLINDAMYCIN <=0.25 SENSITIVE Sensitive     RIFAMPIN <=0.5 SENSITIVE Sensitive     Inducible Clindamycin NEGATIVE Sensitive     * STAPHYLOCOCCUS AUREUS  SARS CORONAVIRUS 2 (TAT 6-24 HRS) Nasopharyngeal Nasopharyngeal Swab     Status: None   Collection Time: 06/27/19  7:57 PM   Specimen: Nasopharyngeal Swab  Result Value Ref Range Status   SARS Coronavirus 2 NEGATIVE NEGATIVE Final    Comment: (NOTE) SARS-CoV-2 target nucleic acids are NOT DETECTED. The SARS-CoV-2 RNA is generally detectable in upper and lower respiratory specimens during the acute phase of infection. Negative results do not preclude SARS-CoV-2 infection, do not rule out co-infections with other pathogens, and should not be used as the sole basis for treatment or other patient management decisions. Negative results must be combined with clinical observations, patient history, and epidemiological information. The expected result is Negative. Fact Sheet for Patients: SugarRoll.be Fact Sheet for Healthcare Providers: https://www.woods-mathews.com/ This test is not yet approved or cleared by the [place] FDA and  has been authorized for detection and/or diagnosis of SARS-CoV-2 by FDA under an Emergency Use Authorization (EUA). This EUA will remain  in effect (meaning this test can be used) for the duration of the COVID-19 declaration under Section 56 4(b)(1) of the Act, 21 U.S.C. section 360bbb-3(b)(1), unless the authorization is terminated or revoked sooner. Performed at [facility], [place] [address] [place], [place] [address]   Culture, blood (routine x 2)     Status: None (Preliminary result)   Collection Time: 06/29/19 10:09 AM   Specimen: BLOOD  Result Value Ref Range Status   Specimen Description BLOOD RIGHT ANTECUBITAL   Final   Special Requests   Final    BOTTLES DRAWN AEROBIC ONLY Blood Culture results may not be optimal due to an inadequate volume of blood received in culture bottles   Culture   Final    NO GROWTH 1 DAY Performed at [facility], [place] [address], [place], [place] [address]    Report Status PENDING  Incomplete  Culture, blood (routine x 2)     Status: None (Preliminary result)   Collection Time: 06/29/19 10:14 AM   Specimen: BLOOD  Result Value Ref Range Status   Specimen Description BLOOD RIGHT ANTECUBITAL  Final   Special Requests   Final    BOTTLES DRAWN AEROBIC ONLY Blood Culture results may not be optimal due to an inadequate volume of blood received in culture bottles   Culture   Final    NO GROWTH 1 DAY Performed at [facility], [place] [address], [place], [place] [address]    Report Status PENDING  Incomplete    Michel Bickers, MD [facility] [place] [facility] [phone] pager   [phone] cell 07/01/2019, 11:32 AM

## 2019-07-01 NOTE — Progress Notes (Signed)
    [facility] has been requested to perform a transesophageal echocardiogram on January 20 for bacteremia.  After careful review of history and examination, the risks and benefits of transesophageal echocardiogram have been explained including risks of esophageal damage, perforation (1:10,000 risk), bleeding, pharyngeal hematoma as well as other potential complications associated with conscious sedation including aspiration, arrhythmia, respiratory failure and death. Alternatives to treatment were discussed, questions were answered. Patient is willing to proceed.   Theodore Demark, PA-C 07/01/2019 3:40 PM

## 2019-07-01 NOTE — Progress Notes (Signed)
Peripherally Inserted Central Catheter/Midline Placement  The IV Nurse has discussed with the patient and/or persons authorized to consent for the patient, the purpose of this procedure and the potential benefits and risks involved with this procedure.  The benefits include less needle sticks, lab draws from the catheter, and the patient may be discharged home with the catheter. Risks include, but not limited to, infection, bleeding, blood clot (thrombus formation), and puncture of an artery; nerve damage and irregular heartbeat and possibility to perform a PICC exchange if needed/ordered by physician.  Alternatives to this procedure were also discussed.  B[employer] PICC patient education guide, fact sheet on infection prevention and patient information card has been provided to patient /or left at bedside.    PICC/Midline Placement Documentation  PICC Single Lumen 07/01/19 PICC Right Brachial 39 cm 0 cm (Active)  Indication for Insertion or Continuance of Line Home intravenous therapies (PICC only) 07/01/19 1800  Exposed Catheter (cm) 0 cm 07/01/19 1800  Site Assessment Clean;Dry;Intact 07/01/19 1800  Line Status Flushed;Saline locked;Blood return noted 07/01/19 1800  Dressing Type Transparent;Securing device 07/01/19 1800  Dressing Status Clean;Dry;Intact;Antimicrobial disc in place 07/01/19 1800  Line Care Connections checked and tightened 07/01/19 1800  Dressing Change Due 07/08/19 07/01/19 1800       [name] 07/01/2019, 6:36 PM

## 2019-07-01 NOTE — Discharge Summary (Signed)
Physician Discharge Summary  Lauren Brown:[id number] DOB: [birth year] DOA: 06/27/2019  PCP: Glenda Chroman, MD  Admit date: 06/27/2019 Discharge date: 07/01/2019  Admitted From: Home Disposition: Home  Recommendations for Outpatient Follow-up:  1. Follow-up on TEE 2. Recommend referral to wound care center  Home Health: Ordered Discharge Condition: Stable CODE STATUS: Full code Consultations:  ID   Discharge Diagnoses:  Principal Problem:   Bacteremia due to methicillin susceptible Staphylococcus aureus (MSSA) Active Problems:   Acute metabolic encephalopathy Chronic Leg wound, left   PVD (peripheral vascular disease) ([place])   Essential hypertension   Hyperlipidemia      Brief Summary: 59 year old female with PMH of peripheral artery disease, hypertension, hyperlipidemia, recent limb threatening ischemia, s/p left iliac artery stenting on 06/24/2019, followed at the wound care center for chronic left lower leg wound, cultures from 03/2019 showed MSSA, wound reportedly was getting better after stenting but still had pink, bloody drainage with a foul odor, initially presented to [facility] on 06/26/2018 due to acute confusion and fever of 101 F.  COVID-19 test was negative.  There was concern regarding acute meningoencephalitis for which she was started on broad-spectrum antimicrobials and transferred to [facility] on 1/3 for possible lumbar puncture and neurology evaluation.  Neurology and ID consulted.  Mental status changes have resolved.  Currently with working diagnosis of transient acute metabolic encephalopathy due to staphylococcal bacteremia from infected chronic left leg wound.  ID recommends TEE to rule out endocarditis, Cardiology consulted.   Hospital Course:  MSSA bacteremia and resultant sepsis (POA) suspected to be related to chronic left leg wound -Patient was started on multiple antibiotics and transferred from [facility] to [place] ID consult -Repeat blood cultures have been negative x48 hours-PICC line placed today -Transthoracic echo completed-no vegetation seen -TEE scheduled for tomorrow-the patient does not want this to be done in the hospital, would like to leave today and would like to have it done as outpatient-I have spoken with Rosaria Ferries, PA with cardiology who will help arrange this as outpatient for next week-the patient is aware that she will need to come to [facility] and most likely will need to have another Covid test prior to this procedure -Ancef will be continued as outpatient- home health RN requested  Acute Metabolic encephalopathy  -MRI of the brain was performed and initially it was suspected that the patient had an acute punctate infarct in the central aspect of the splenium- -She was evaluated by the stroke team not feel that this was an acute infarct-Dr. Leonie Man did note that "hyperintensities have been described in variety of conditions including sepsis and low B12 states which the patient has"  Infected chronic left leg wound -Imaging of the leg including an MRI shows cellulitis but no abscess or osteomyelitis -Wound care has been consulted and instructions as follows: Cleanse LLE wound along the shin with No Rinse Spray. Dab clean.  Place a small piece of Aquacel Kellie Simmering [phone]) into the wound. Wrap this area and the fissure at the achilles tendon area with kerlex. Change daily.  -Patient will continue to follow with wound care as outpatient  Peripheral artery disease -She underwent a left common iliac artery stent placement on 12/30 at [facility] for limb threatening ischemia -Continue aspirin Plavix  Dyslipidemia -Continue statins  Low TSH -Likely sick euthyroid syndrome-can be repeated in outpatient in 6 weeks  Vitamin B12 deficiency - Vit B12 level was 212 -started on replacement  Hypokalemia - replaced    Discharge Exam: Vitals:   07/01/19 0828 07/01/19  1239  BP: 134/72 139/72  Pulse: 69 63  Resp: 18 16  Temp: 98.8 F (37.1 C) 98.2 F (36.8 C)  SpO2: 99% 100%   Vitals:   07/01/19 0401 07/01/19 0800 07/01/19 0828 07/01/19 1239  BP: 132/72  134/72 139/72  Pulse: 63  69 63  Resp:   18 16  Temp: 99.6 F (37.6 C) 98.7 F (37.1 C) 98.8 F (37.1 C) 98.2 F (36.8 C)  TempSrc: Oral  Oral Oral  SpO2: 98%  99% 100%  Weight:      Height:        General: Pt is alert, awake, not in acute distress Cardiovascular: RRR, S1/S2 +, no rubs, no gallops Respiratory: CTA bilaterally, no wheezing, no rhonchi Abdominal: Soft, NT, ND, bowel sounds + Extremities: no edema, no cyanosis-wound on left leg is currently packed-no surrounding cellulitis is noted   Discharge Instructions  Discharge Instructions    Home infusion instructions   Complete by: As directed    Instructions: Flushing of vascular access device: 0.9% NaCl pre/post medication administration and prn patency; Heparin 100 u/ml, 74m for implanted ports and Heparin 10u/ml, 550mfor all other central venous catheters.   Home infusion instructions   Complete by: As directed    Instructions: Flushing of vascular access device: 0.9% NaCl pre/post medication administration and prn patency; Heparin 100 u/ml, 50m950mor implanted ports and Heparin 10u/ml, 50ml23mr all other central venous catheters.     Allergies as of 07/01/2019   No Known Allergies     Medication List    TAKE these medications   aspirin 81 MG EC tablet Take 81 mg by mouth daily.   augmented betamethasone dipropionate 0.05 % cream Commonly known as: DIPROLENE-AF Apply 1 application topically 2 (two) times daily. Applied to hands (Not to be applied to the face, groin, or underarm areas)   ceFAZolin  IVPB Commonly known as: ANCEF Inject 2 g into the vein every 8 (eight) hours. Indication:  MSSA bacteremia Last Day of Therapy:  07/26/18 Labs - Once weekly:  CBC/D and BMP, Labs - Every other week:  ESR and CRP    clopidogrel 75 MG tablet Commonly known as: PLAVIX Take 75 mg by mouth daily.   Santyl ointment Generic drug: collagenase Apply 1 application topically See admin instructions. Apply to wound of leg leg with each dressing change as directed   simvastatin 20 MG tablet Commonly known as: ZOCOR Take 20 mg by mouth at bedtime.   triamcinolone cream 0.1 % Commonly known as: KENALOG Apply 1 application topically 2 (two) times daily.   vitamin B-12 500 MCG tablet Commonly known as: CYANOCOBALAMIN Take 1 tablet (500 mcg total) by mouth daily. Start taking on: July 02, 2019            Home Infusion Instuctions  (From admission, onward)         Start     Ordered   07/01/19 0000  Home infusion instructions    Question:  Instructions  Answer:  Flushing of vascular access device: 0.9% NaCl pre/post medication administration and prn patency; Heparin 100 u/ml, 50ml 20m implanted ports and Heparin 10u/ml, 50ml f51mall other central venous catheters.   07/01/19 1232   07/01/19 0000  Home infusion instructions    Question:  Instructions  Answer:  Flushing of vascular access device: 0.9% NaCl pre/post medication administration and prn patency; Heparin 100 u/ml,  310m for implanted ports and Heparin 10u/ml, 542mfor all other central venous catheters.   07/01/19 1541         Follow-up Information    [facility] Follow up.   Why: Home IV abx arranged- [facility] ([phone]  Contact information: [place] on 07/15/2019.   Why: Transesophageal echocardiogram Please arrive at 8:00 AM for 9:00 AM procedure. Contact information: [phone]     [place] 07[place].   Why: Go at any time during the day and look for the Covid testing site.  You need a Covid test before you can have the  procedure on January 20. Contact information: [phone]       No Known Allergies   Procedures/Studies:   CT Head Wo Contrast  Result Date: 06/27/2019 CLINICAL DATA:  Stroke-like symptoms EXAM: CT HEAD WITHOUT CONTRAST TECHNIQUE: Contiguous axial images were obtained from the base of the skull through the vertex without intravenous contrast. COMPARISON:  None. FINDINGS: Brain: No evidence of acute infarction, hemorrhage, hydrocephalus, extra-axial collection or mass lesion/mass effect. Vascular: No hyperdense vessel or unexpected calcification. Skull: Normal. Negative for fracture or focal lesion. Sinuses/Orbits: No acute finding. Other: None. IMPRESSION: No acute intracranial abnormality noted. Electronically Signed   By: MaInez Catalina.D.   On: 06/27/2019 17:12   MR ANGIO HEAD WO CONTRAST  Result Date: 06/29/2019 CLINICAL DATA:  Altered mental status. Cytotoxic lesion in the splenium of the corpus callosum on MRI. EXAM: MRA HEAD WITHOUT CONTRAST TECHNIQUE: Angiographic images of the Circle of Willis were obtained using MRA technique without intravenous contrast. COMPARISON:  None. FINDINGS: The study is intermittently moderately to severely motion degraded. The visualized distal vertebral arteries are patent to the basilar with the left being moderately dominant. Patent PICA and SCA origins are identified bilaterally. The basilar artery is patent with assessment for stenosis limited by motion artifact including severe focal motion through its midportion. There are left larger than right posterior communicating arteries with a fetal origin of the left PCA. Both PCAs are patent without evidence of significant proximal stenosis. The internal carotid arteries are patent from skull base to carotid termini with motion artifact limiting assessment for stenosis. ACAs and MCAs are patent without evidence of proximal branch occlusion. No  aneurysm is identified. IMPRESSION: Motion degraded examination limiting assessment for arterial stenosis. No major vessel occlusion. Electronically Signed   By: AlLogan Bores.D.   On: 06/29/2019 21:32   MR BRAIN W WO CONTRAST  Result Date: 06/29/2019 CLINICAL DATA:  Initial evaluation for increased confusion, incoherent speech, lethargy. EXAM: MRI HEAD WITHOUT AND WITH CONTRAST TECHNIQUE: Multiplanar, multiecho pulse sequences of the brain and surrounding structures were obtained without and with intravenous contrast. CONTRAST:  10m73mADAVIST GADOBUTROL 1 MMOL/ML IV SOLN COMPARISON:  Prior head CT from 06/27/2019. FINDINGS: Brain: Examination mildly degraded by motion artifact. Cerebral volume within normal limits for age. Mild scattered patchy T2/FLAIR hyperintensity within the periventricular deep white matter both cerebral hemispheres, most like related to mild chronic small vessel ischemic disease. There is a subtle 7 mm focus of restricted diffusion seen involving the central aspect of the splenium (series 5, image 75). Signal abnormality is somewhat linear in nature, and favored to reflect a small  acute ischemic infarct. No associated hemorrhage or mass effect. No other evidence for acute or subacute ischemia. Gray-white matter differentiation otherwise maintained. No encephalomalacia to suggest chronic cortical infarction. No foci of susceptibility artifact to suggest acute or chronic intracranial hemorrhage. No mass lesion, midline shift or mass effect. No hydrocephalus. No extra-axial fluid collection. Pituitary gland and suprasellar region within normal limits. No abnormal enhancement. Vascular: Major intracranial vascular flow voids are maintained. Skull and upper cervical spine: Craniocervical junction normal. Scattered degenerative changes noted within the upper cervical spine without high-grade stenosis. Bone marrow signal intensity within normal limits. No acute scalp soft tissue abnormality.  Sinuses/Orbits: Globes and orbital soft tissues within normal limits. Paranasal sinuses are clear. No significant mastoid effusion. Inner ear structures grossly normal. Other: None. IMPRESSION: 1. 7 mm focus of restricted diffusion involving the central aspect of the splenium of the splenium of the brain, favored to reflect a small acute ischemic infarct. No associated hemorrhage or mass effect. Primary differential considerations include sequelae of seizure versus toxic metabolic derangement. 2. No other acute intracranial process identified. 3. Mild chronic microvascular ischemic disease. Electronically Signed   By: Jeannine Boga M.D.   On: 06/29/2019 01:56   MR TIBIA FIBULA LEFT WO CONTRAST  Result Date: 06/29/2019 CLINICAL DATA:  Limb threatening ischemia. Recent left common iliac artery stent placement. Clinical concern for osteomyelitis EXAM: MRI OF LOWER LEFT EXTREMITY WITHOUT CONTRAST TECHNIQUE: Multiplanar, multisequence MR imaging of the left tibia and fibula was performed. No intravenous contrast was administered. COMPARISON:  X-ray 04/11/2018 FINDINGS: Bones/Joint/Cartilage No acute fracture. No malalignment. There is preservation of the fatty T1 marrow signal throughout the visualized osseous structures. No bone marrow edema or marrow replacing lesion. No cortical destruction or periostitis. No knee or tibiotalar joint effusion. Suspect horizontal tear of the lateral meniscal body (series 10, image 18) which is suboptimally characterized on large field-of-view images. Ligaments No evidence of ligamentous injury. Muscles and Tendons Normal muscle bulk and signal intensity without intramuscular edema, atrophy, or fatty infiltration. Visualized tendinous structures are intact without injury or fluid collection. Soft tissues Soft tissue defect involving the anterior subcutaneous soft tissues overlying the distal tibial diaphysis (series 25, image 18). Defect involves the superficial soft tissues  with preservation of the underlying investing fascia of the anterior compartment musculature. There is surrounding soft tissue edema and skin thickening suggestive of cellulitis. No well-defined or drainable fluid collections. No deep fascial fluid. IMPRESSION: 1. Soft tissue defect/ulceration involving the anterior subcutaneous soft tissues overlying the distal tibial diaphysis with surrounding soft tissue edema and skin thickening suggestive of cellulitis. No well-defined or drainable fluid collections. 2. No evidence of osteomyelitis. 3. Suspect horizontal tear of the lateral meniscal body which is suboptimally characterized on large field-of-view images. These findings are in agreement with the preliminary report provided by Dr. Francoise Ceo at 1:42 a.m. on 06/29/2019 which was documented in PACS of. Electronically Signed   By: Davina Poke D.O.   On: 06/29/2019 08:14   DG Chest Port 1 View  Result Date: 06/27/2019 CLINICAL DATA:  Fever.  Left leg wound. EXAM: PORTABLE CHEST 1 VIEW COMPARISON:  05/06/2017, report only FINDINGS: AP portable chest film. The costophrenic angles and inferior-most chest are excluded. Midline trachea. Mild cardiomegaly. Tortuous thoracic aorta. No large pleural fluid. No pneumothorax. Basilar predominant areas of linear interstitial thickening and scarring. No lobar consolidation. IMPRESSION: No acute cardiopulmonary disease. Cardiomegaly without congestive failure. Electronically Signed   By: Abigail Miyamoto M.D.   On: 06/27/2019 16:57  ECHOCARDIOGRAM COMPLETE  Result Date: 06/28/2019   ECHOCARDIOGRAM REPORT   Patient Name:   Lauren Brown Date of Exam: 06/28/2019 Medical Rec #:  [id number]      Height:       67.0 in Accession #:    [id number]     Weight:       175.0 lb Date of Birth:  [birth year]      BSA:          1.91 m Patient Age:    59 years       BP:           128/73 mmHg Patient Gender: F              HR:           85 bpm. Exam Location:  [facility] Procedure: 2D Echo  Indications:    fever  History:        Patient has no prior history of Echocardiogram examinations.                 Risk Factors:Current Smoker.  Sonographer:    Jannett Celestine RDCS (AE) Referring Phys: [place]  Sonographer Comments: Suboptimal parasternal window. IMPRESSIONS  1. Left ventricular ejection fraction, by visual estimation, is 60 to 65%. The left ventricle has normal function. There is no left ventricular hypertrophy.  2. The left ventricle has no regional wall motion abnormalities.  3. Global right ventricle has normal systolic function.The right ventricular size is normal. No increase in right ventricular wall thickness.  4. Left atrial size was normal.  5. Right atrial size was normal.  6. The mitral valve is grossly normal. No evidence of mitral valve regurgitation.  7. The tricuspid valve is grossly normal.  8. The aortic valve was not well visualized. Aortic valve regurgitation is not visualized.  9. The pulmonic valve was grossly normal. Pulmonic valve regurgitation is not visualized. 10. TR signal is inadequate for assessing pulmonary artery systolic pressure. 11. The inferior vena cava is normal in size with greater than 50% respiratory variability, suggesting right atrial pressure of 3 mmHg. 12. No obvious valvular vegetations. FINDINGS  Left Ventricle: Left ventricular ejection fraction, by visual estimation, is 60 to 65%. The left ventricle has normal function. The left ventricle has no regional wall motion abnormalities. The left ventricular internal cavity size was the left ventricle is normal in size. There is no left ventricular hypertrophy. Left ventricular diastolic parameters were normal. Right Ventricle: The right ventricular size is normal. No increase in right ventricular wall thickness. Global RV systolic function is has normal systolic function. Left Atrium: Left atrial size was normal in size. Right Atrium: Right atrial size was normal in size Pericardium: There  is no evidence of pericardial effusion. Mitral Valve: The mitral valve is grossly normal. No evidence of mitral valve regurgitation. Tricuspid Valve: The tricuspid valve is grossly normal. Tricuspid valve regurgitation is trivial. Aortic Valve: The aortic valve was not well visualized. Aortic valve regurgitation is not visualized. Pulmonic Valve: The pulmonic valve was grossly normal. Pulmonic valve regurgitation is not visualized. Pulmonic regurgitation is not visualized. Aorta: The aortic root is normal in size and structure. Venous: The inferior vena cava is normal in size with greater than 50% respiratory variability, suggesting right atrial pressure of 3 mmHg. IAS/Shunts: No atrial level shunt detected by color flow Doppler.  LEFT VENTRICLE PLAX 2D LVIDd:         5.05 cm  Diastology LVIDs:  2.66 cm  LV e' lateral:   12.10 cm/s LV PW:         0.92 cm  LV E/e' lateral: 5.6 LV IVS:        0.70 cm  LV e' medial:    8.92 cm/s LVOT diam:     1.80 cm  LV E/e' medial:  7.6 LV SV:         95 ml LV SV Index:   48.53 LVOT Area:     2.54 cm  LEFT ATRIUM           Index LA diam:      3.50 cm 1.83 cm/m LA Vol (A4C): 26.3 ml 13.76 ml/m  AORTIC VALVE LVOT Vmax:   115.00 cm/s LVOT Vmean:  85.800 cm/s LVOT VTI:    0.166 m  AORTA Ao Root diam: 2.70 cm MITRAL VALVE MV Area (PHT): 3.12 cm             SHUNTS MV PHT:        70.47 msec           Systemic VTI:  0.17 m MV Decel Time: 243 msec             Systemic Diam: 1.80 cm MV E velocity: 67.40 cm/s 103 cm/s MV A velocity: 72.30 cm/s 70.3 cm/s MV E/A ratio:  0.93       1.5  Rozann Lesches MD Electronically signed by Rozann Lesches MD Signature Date/Time: 06/28/2019/11:44:15 AM    Final    VAS US CAROTID  Result Date: 06/29/2019 Carotid Arterial Duplex Study Indications:       Speech disturbance. Comparison Study:  No prior study. Performing Technologist: Maudry Mayhew MHA, RDMS, RVT, RDCS  Examination Guidelines: A complete evaluation includes B-mode imaging,  spectral Doppler, color Doppler, and power Doppler as needed of all accessible portions of each vessel. Bilateral testing is considered an integral part of a complete examination. Limited examinations for reoccurring indications may be performed as noted.  Right Carotid Findings: +----------+-------+-------+--------+---------------------------------+--------+           PSV    EDV    StenosisPlaque Description               Comments           cm/s   cm/s                                                     +----------+-------+-------+--------+---------------------------------+--------+ CCA Prox  88     22                                                       +----------+-------+-------+--------+---------------------------------+--------+ CCA Distal81     24             irregular and heterogenous                +----------+-------+-------+--------+---------------------------------+--------+ ICA Prox  104    34             heterogenous, irregular and  calcific                                  +----------+-------+-------+--------+---------------------------------+--------+ ICA Distal87     29                                                       +----------+-------+-------+--------+---------------------------------+--------+ ECA       81     12             smooth and heterogenous                   +----------+-------+-------+--------+---------------------------------+--------+ +----------+--------+-------+----------------+-------------------+           PSV cm/sEDV cmsDescribe        Arm Pressure (mmHG) +----------+--------+-------+----------------+-------------------+ [id number]            Multiphasic, WNL                    +----------+--------+-------+----------------+-------------------+ +---------+--------+--+--------+--+---------+ VertebralPSV cm/s42EDV cm/s12Antegrade  +---------+--------+--+--------+--+---------+  Left Carotid Findings: +----------+--------+--------+--------+--------------------------+--------+           PSV cm/sEDV cm/sStenosisPlaque Description        Comments +----------+--------+--------+--------+--------------------------+--------+ CCA Prox  127     17                                                 +----------+--------+--------+--------+--------------------------+--------+ CCA Distal84      32              smooth and heterogenous            +----------+--------+--------+--------+--------------------------+--------+ ICA Prox  147     68              irregular and heterogenous         +----------+--------+--------+--------+--------------------------+--------+ ICA Distal98      42                                                 +----------+--------+--------+--------+--------------------------+--------+ ECA       87      17              heterogenous and smooth            +----------+--------+--------+--------+--------------------------+--------+ +----------+--------+--------+----------------+-------------------+           PSV cm/sEDV cm/sDescribe        Arm Pressure (mmHG) +----------+--------+--------+----------------+-------------------+ [id number]             Multiphasic, WNL                    +----------+--------+--------+----------------+-------------------+ +---------+--------+--+--------+-+---------+ VertebralPSV cm/s39EDV cm/s7Antegrade +---------+--------+--+--------+-+---------+  Summary: Right Carotid: Velocities in the right ICA are consistent with a 1-39% stenosis. Left Carotid: Velocities in the left ICA are consistent with a 40-59% stenosis. Vertebrals:  Bilateral vertebral arteries demonstrate antegrade flow. Subclavians: Normal flow hemodynamics were seen in bilateral subclavian              arteries. *See table(s) above for measurements and observations.  Electronically signed by  Ruta Hinds MD on 06/29/2019 at 4:41:40 PM.    Final    [place] EKG SITE RITE  Result Date: 07/01/2019 If Site Rite image not attached, placement could not be confirmed due to current cardiac rhythm.  [place] EKG SITE RITE  Result Date: 06/30/2019 If Site Rite image not attached, placement could not be confirmed due to current cardiac rhythm.    The results of significant diagnostics from this hospitalization (including imaging, microbiology, ancillary and laboratory) are listed below for reference.     Microbiology: Recent Results (from the past 240 hour(s))  Urine culture     Status: Abnormal   Collection Time: 06/27/19  5:33 PM   Specimen: In/Out Cath Urine  Result Value Ref Range Status   Specimen Description   Final    IN/OUT CATH URINE Performed at [facility], [address], [place], [place] [address]    Special Requests   Final    NONE Performed at [facility], [address] [place], [place] [address]    Culture >=100,000 COLONIES/mL STAPHYLOCOCCUS EPIDERMIDIS (A)  Final   Report Status 07/01/2019 FINAL  Final   Organism ID, Bacteria STAPHYLOCOCCUS EPIDERMIDIS (A)  Final      Susceptibility   Staphylococcus epidermidis - MIC*    CIPROFLOXACIN <=0.5 SENSITIVE Sensitive     GENTAMICIN <=0.5 SENSITIVE Sensitive     NITROFURANTOIN <=16 SENSITIVE Sensitive     OXACILLIN <=0.25 SENSITIVE Sensitive     TETRACYCLINE <=1 SENSITIVE Sensitive     VANCOMYCIN 1 SENSITIVE Sensitive     TRIMETH/SULFA 160 RESISTANT Resistant     CLINDAMYCIN <=0.25 SENSITIVE Sensitive     RIFAMPIN <=0.5 SENSITIVE Sensitive     Inducible Clindamycin NEGATIVE Sensitive     * >=100,000 COLONIES/mL STAPHYLOCOCCUS EPIDERMIDIS  Blood Culture (routine x 2)     Status: Abnormal   Collection Time: 06/27/19  6:02 PM   Specimen: Right Antecubital; Blood  Result Value Ref Range Status   Specimen Description   Final    RIGHT ANTECUBITAL Performed at [facility], [address] [place], [place] [address]     Special Requests   Final    BOTTLES DRAWN AEROBIC AND ANAEROBIC Blood Culture adequate volume Performed at [facility], [address], [place], [place] [address]    Culture  Setup Time   Final    GRAM POSITIVE COCCI AEROBIC BOTTLE ONLY Gram Stain Report Called to,Read Back By and Verified With: RACHAEL H.,RN '@[id number]'  06/28/2019 KAY Performed at [facility], [address], [place], [place] [address]    Culture STAPHYLOCOCCUS AUREUS (A)  Final   Report Status 06/30/2019 FINAL  Final   Organism ID, Bacteria STAPHYLOCOCCUS AUREUS  Final      Susceptibility   Staphylococcus aureus - MIC*    CIPROFLOXACIN >=8 RESISTANT Resistant     ERYTHROMYCIN >=8 RESISTANT Resistant     GENTAMICIN <=0.5 SENSITIVE Sensitive     OXACILLIN 0.5 SENSITIVE Sensitive     TETRACYCLINE <=1 SENSITIVE Sensitive     VANCOMYCIN <=0.5 SENSITIVE Sensitive     TRIMETH/SULFA <=10 SENSITIVE Sensitive     CLINDAMYCIN <=0.25 SENSITIVE Sensitive     RIFAMPIN <=0.5 SENSITIVE Sensitive     Inducible Clindamycin NEGATIVE Sensitive     * STAPHYLOCOCCUS AUREUS  SARS CORONAVIRUS 2 (TAT 6-24 HRS) Nasopharyngeal Nasopharyngeal Swab     Status: None   Collection Time: 06/27/19  7:57 PM   Specimen: Nasopharyngeal Swab  Result Value Ref Range Status   SARS Coronavirus 2 NEGATIVE  NEGATIVE Final    Comment: (NOTE) SARS-CoV-2 target nucleic acids are NOT DETECTED. The SARS-CoV-2 RNA is generally detectable in upper and lower respiratory specimens during the acute phase of infection. Negative results do not preclude SARS-CoV-2 infection, do not rule out co-infections with other pathogens, and should not be used as the sole basis for treatment or other patient management decisions. Negative results must be combined with clinical observations, patient history, and epidemiological information. The expected result is Negative. Fact Sheet for Patients: SugarRoll.be Fact Sheet for Healthcare  Providers: https://www.woods-mathews.com/ This test is not yet approved or cleared by the [place] FDA and  has been authorized for detection and/or diagnosis of SARS-CoV-2 by FDA under an Emergency Use Authorization (EUA). This EUA will remain  in effect (meaning this test can be used) for the duration of the COVID-19 declaration under Section 56 4(b)(1) of the Act, 21 U.S.C. section 360bbb-3(b)(1), unless the authorization is terminated or revoked sooner. Performed at [facility], [place] [address] [place], [place] [address]   Culture, blood (routine x 2)     Status: None (Preliminary result)   Collection Time: 06/29/19 10:09 AM   Specimen: BLOOD  Result Value Ref Range Status   Specimen Description BLOOD RIGHT ANTECUBITAL  Final   Special Requests   Final    BOTTLES DRAWN AEROBIC ONLY Blood Culture results may not be optimal due to an inadequate volume of blood received in culture bottles   Culture   Final    NO GROWTH 2 DAYS Performed at [facility], [place] [address] [place], [place] [address]    Report Status PENDING  Incomplete  Culture, blood (routine x 2)     Status: None (Preliminary result)   Collection Time: 06/29/19 10:14 AM   Specimen: BLOOD  Result Value Ref Range Status   Specimen Description BLOOD RIGHT ANTECUBITAL  Final   Special Requests   Final    BOTTLES DRAWN AEROBIC ONLY Blood Culture results may not be optimal due to an inadequate volume of blood received in culture bottles   Culture   Final    NO GROWTH 2 DAYS Performed at [facility], [place] [address] [place], [place] [address]    Report Status PENDING  Incomplete     Labs: BNP (last 3 results) No results for input(s): BNP in the last 8760 hours. Basic Metabolic Panel: Recent Labs  Lab 06/27/19 1802 06/28/19 1025 06/29/19 0911 06/30/19 0803  NA 133* 130* 134* 136  K 3.3* 3.2* 3.6 3.3*  CL 97* 98 99 98  CO2 '22 22 26 26  ' GLUCOSE 117* 233* 115* 112*  BUN '12  15 7 ' 5*  CREATININE 0.88 0.84 0.72 0.63  CALCIUM 8.3* 8.2* 8.2* 8.6*   Liver Function Tests: Recent Labs  Lab 06/27/19 1802 06/28/19 1025 06/29/19 0911  AST 18 32 33  ALT '13 20 21  ' ALKPHOS 82 71 66  BILITOT 1.3* 0.9 0.5  PROT 7.9 7.0 6.1*  ALBUMIN 3.5 3.1* 2.6*   No results for input(s): LIPASE, AMYLASE in the last 168 hours. Recent Labs  Lab 06/28/19 1025  AMMONIA 27   CBC: Recent Labs  Lab 06/27/19 1802 06/28/19 1025 06/29/19 0911 06/30/19 0803  WBC 17.1* 11.8* 7.8 8.2  NEUTROABS 15.3*  --   --   --   HGB 12.6 12.1 11.9* 11.7*  HCT 39.8 37.9 36.5 36.0  MCV 86.1 84.0 81.7 82.2  PLT 264 202 202 253   Cardiac  Enzymes: No results for input(s): CKTOTAL, CKMB, CKMBINDEX, TROPONINI in the last 168 hours. BNP: Invalid input(s): POCBNP CBG: No results for input(s): GLUCAP in the last 168 hours. D-Dimer No results for input(s): DDIMER in the last 72 hours. Hgb A1c Recent Labs    06/30/19 0803 06/30/19 1812  HGBA1C 5.8* 5.9*   Lipid Profile Recent Labs    06/30/19 0803  CHOL 116  HDL 17*  LDLCALC 70  TRIG 145  CHOLHDL 6.8   Thyroid function studies Recent Labs    06/30/19 1800  TSH 2.404  T3FREE 2.3   Anemia work up No results for input(s): VITAMINB12, FOLATE, FERRITIN, TIBC, IRON, RETICCTPCT in the last 72 hours. Urinalysis    Component Value Date/Time   COLORURINE YELLOW 06/27/2019 1733   APPEARANCEUR HAZY (A) 06/27/2019 1733   LABSPEC 1.016 06/27/2019 1733   PHURINE 5.0 06/27/2019 1733   GLUCOSEU NEGATIVE 06/27/2019 1733   HGBUR LARGE (A) 06/27/2019 1733   BILIRUBINUR NEGATIVE 06/27/2019 1733   KETONESUR 5 (A) 06/27/2019 1733   PROTEINUR 30 (A) 06/27/2019 1733   NITRITE NEGATIVE 06/27/2019 1733   LEUKOCYTESUR NEGATIVE 06/27/2019 1733   Sepsis Labs Invalid input(s): PROCALCITONIN,  WBC,  LACTICIDVEN Microbiology Recent Results (from the past 240 hour(s))  Urine culture     Status: Abnormal   Collection Time: 06/27/19  5:33 PM    Specimen: In/Out Cath Urine  Result Value Ref Range Status   Specimen Description   Final    IN/OUT CATH URINE Performed at [facility], [address] [place], [place] [address]    Special Requests   Final    NONE Performed at [facility], [address], [place], [place] [address]    Culture >=100,000 COLONIES/mL STAPHYLOCOCCUS EPIDERMIDIS (A)  Final   Report Status 07/01/2019 FINAL  Final   Organism ID, Bacteria STAPHYLOCOCCUS EPIDERMIDIS (A)  Final      Susceptibility   Staphylococcus epidermidis - MIC*    CIPROFLOXACIN <=0.5 SENSITIVE Sensitive     GENTAMICIN <=0.5 SENSITIVE Sensitive     NITROFURANTOIN <=16 SENSITIVE Sensitive     OXACILLIN <=0.25 SENSITIVE Sensitive     TETRACYCLINE <=1 SENSITIVE Sensitive     VANCOMYCIN 1 SENSITIVE Sensitive     TRIMETH/SULFA 160 RESISTANT Resistant     CLINDAMYCIN <=0.25 SENSITIVE Sensitive     RIFAMPIN <=0.5 SENSITIVE Sensitive     Inducible Clindamycin NEGATIVE Sensitive     * >=100,000 COLONIES/mL STAPHYLOCOCCUS EPIDERMIDIS  Blood Culture (routine x 2)     Status: Abnormal   Collection Time: 06/27/19  6:02 PM   Specimen: Right Antecubital; Blood  Result Value Ref Range Status   Specimen Description   Final    RIGHT ANTECUBITAL Performed at [facility], [address], [place], [place] [address]    Special Requests   Final    BOTTLES DRAWN AEROBIC AND ANAEROBIC Blood Culture adequate volume Performed at [facility], [address] [place], [place] [address]    Culture  Setup Time   Final    GRAM POSITIVE COCCI AEROBIC BOTTLE ONLY Gram Stain Report Called to,Read Back By and Verified With: RACHAEL H.,RN '@[id number]'  06/28/2019 KAY Performed at [facility], [address], [place], [place] [address]    Culture STAPHYLOCOCCUS AUREUS (A)  Final   Report Status 06/30/2019 FINAL  Final   Organism ID, Bacteria STAPHYLOCOCCUS AUREUS  Final      Susceptibility   Staphylococcus aureus - MIC*    CIPROFLOXACIN >=8 RESISTANT Resistant  ERYTHROMYCIN >=8 RESISTANT Resistant     GENTAMICIN <=0.5 SENSITIVE Sensitive     OXACILLIN 0.5 SENSITIVE Sensitive     TETRACYCLINE <=1 SENSITIVE Sensitive     VANCOMYCIN <=0.5 SENSITIVE Sensitive     TRIMETH/SULFA <=10 SENSITIVE Sensitive     CLINDAMYCIN <=0.25 SENSITIVE Sensitive     RIFAMPIN <=0.5 SENSITIVE Sensitive     Inducible Clindamycin NEGATIVE Sensitive     * STAPHYLOCOCCUS AUREUS  SARS CORONAVIRUS 2 (TAT 6-24 HRS) Nasopharyngeal Nasopharyngeal Swab     Status: None   Collection Time: 06/27/19  7:57 PM   Specimen: Nasopharyngeal Swab  Result Value Ref Range Status   SARS Coronavirus 2 NEGATIVE NEGATIVE Final    Comment: (NOTE) SARS-CoV-2 target nucleic acids are NOT DETECTED. The SARS-CoV-2 RNA is generally detectable in upper and lower respiratory specimens during the acute phase of infection. Negative results do not preclude SARS-CoV-2 infection, do not rule out co-infections with other pathogens, and should not be used as the sole basis for treatment or other patient management decisions. Negative results must be combined with clinical observations, patient history, and epidemiological information. The expected result is Negative. Fact Sheet for Patients: SugarRoll.be Fact Sheet for Healthcare Providers: https://www.woods-mathews.com/ This test is not yet approved or cleared by the [place] FDA and  has been authorized for detection and/or diagnosis of SARS-CoV-2 by FDA under an Emergency Use Authorization (EUA). This EUA will remain  in effect (meaning this test can be used) for the duration of the COVID-19 declaration under Section 56 4(b)(1) of the Act, 21 U.S.C. section 360bbb-3(b)(1), unless the authorization is terminated or revoked sooner. Performed at [facility], [place] [address], [place], [place] [address]   Culture, blood (routine x 2)     Status: None (Preliminary result)   Collection Time: 06/29/19  10:09 AM   Specimen: BLOOD  Result Value Ref Range Status   Specimen Description BLOOD RIGHT ANTECUBITAL  Final   Special Requests   Final    BOTTLES DRAWN AEROBIC ONLY Blood Culture results may not be optimal due to an inadequate volume of blood received in culture bottles   Culture   Final    NO GROWTH 2 DAYS Performed at [facility], [place] [address] [place], [place] [address]    Report Status PENDING  Incomplete  Culture, blood (routine x 2)     Status: None (Preliminary result)   Collection Time: 06/29/19 10:14 AM   Specimen: BLOOD  Result Value Ref Range Status   Specimen Description BLOOD RIGHT ANTECUBITAL  Final   Special Requests   Final    BOTTLES DRAWN AEROBIC ONLY Blood Culture results may not be optimal due to an inadequate volume of blood received in culture bottles   Culture   Final    NO GROWTH 2 DAYS Performed at [facility], [place] [address] [place], [place] [address]    Report Status PENDING  Incomplete     Time coordinating discharge in minutes: 65  SIGNED:   Debbe Odea, MD  [facility] 07/01/2019, 4:47 PM Pager   If 7PM-7AM, please contact night-coverage [email or web address] Password [email or web address]

## 2019-07-01 NOTE — Progress Notes (Signed)
PT Cancellation Note  Patient Details Name: Lauren Brown MRN: [id number] DOB: [birth year]   Cancelled Treatment:    Reason Eval/Treat Not Completed: PT screened, no needs identified, will sign off - PT signed off yesterday due to pt mobilizing at baseline level of mobility, PT re-consulted this am. PT spoke with pt at bedside, who states she still feels she is baseline for mobility and does not feel further acute care PT is warranted. PT to sign off.  Richrd Sox, PT Acute Rehabilitation Services Pager [phone]  Office [phone]    Tyrone Apple D Despina Hidden 07/01/2019, 11:41 AM

## 2019-07-01 NOTE — Progress Notes (Signed)
PHARMACY CONSULT NOTE FOR:  OUTPATIENT  PARENTERAL ANTIBIOTIC THERAPY (OPAT)  Indication: MSSA bacteremia Regimen: Ancef 2gm IV Q8H End date: 07/26/18  IV antibiotic discharge orders are pended. To discharging provider:  please sign these orders via discharge navigator,  Select New Orders & click on the button choice - Manage This Unsigned Work.     Thank you for allowing pharmacy to be a part of this patient's care.   Marysue Fait D. Laney Potash, PharmD, BCPS, BCCCP 07/01/2019, 12:35 PM

## 2019-07-01 NOTE — Discharge Instructions (Signed)
DO NOT eat or drink anything after midnight 1/11, for the transesophageal echocardiogram procedure on 1/12. Bring a.m. meds with you, can take them after the procedure. Have someone drive you, you will not be able to drive.  Wound care: Cleanse LLE wound along the shin with No Rinse Spray. Dab clean.  Place a small piece of Aquacel [place] [phone]) into the wound. Wrap this area and the fissure at the achilles tendon area with kerlex. Change daily.  You were cared for by a hospitalist during your hospital stay. If you have any questions about your discharge medications or the care you received while you were in the hospital after you are discharged, you can call the unit and asked to speak with the hospitalist on call if the hospitalist that took care of you is not available. Once you are discharged, your primary care physician will handle any further medical issues.   Please note that NO REFILLS for any discharge medications will be authorized once you are discharged, as it is imperative that you return to your primary care physician (or establish a relationship with a primary care physician if you do not have one) for your aftercare needs so that they can reassess your need for medications and monitor your lab values.  Please take all your medications with you for your next visit with your Primary MD. Please ask your Primary MD to get all Hospital records sent to his/her office. Please request your Primary MD to go over all hospital test results at the follow up.   If you experience worsening of your admission symptoms, develop shortness of breath, chest pain, suicidal or homicidal thoughts or a life threatening emergency, you must seek medical attention immediately by calling 911 or calling your MD.   Bonita Quin must read the complete instructions/literature along with all the possible adverse reactions/side effects for all the medicines you take including new medications that have been prescribed to you.  Take new medicines after you have completely understood and accpet all the possible adverse reactions/side effects.    Do not drive when taking pain medications or sedatives.     Do not take more than prescribed Pain, Sleep and Anxiety Medications   If you have smoked or chewed Tobacco in the last 2 yrs please stop. Stop any regular alcohol  and or recreational drug use.   Wear Seat belts while driving.   Bacteremia, Adult Bacteremia is the presence of bacteria in the blood. When bacteria enter the bloodstream, they can cause a life-threatening reaction called sepsis. Sepsis is a medical emergency. What are the causes? This condition is caused by bacteria that get into the blood. Bacteria can enter the blood from an infection, including:  A skin infection or injury, such as a burn or a cut.  A lung infection (pneumonia).  An infection in the stomach or intestines.  An infection in the bladder or urinary system (urinary tract infection).  A bacterial infection in another part of the body that spreads to the blood. Bacteria can also enter the blood during a dental or medical procedure, from bleeding gums, or through use of an unclean needle. What increases the risk? This condition is more likely to develop in children, older adults, and people who have:  A long-term (chronic) disease or condition like diabetes or chronic kidney failure.  An artificial joint or heart valve, or heart valve disease.  A tube inserted to treat a medical condition, such as a urinary catheter or IV.  A weak disease-fighting system (immune system).  Injected illegal drugs.  Been hospitalized for more than 10 days in a row. What are the signs or symptoms? Symptoms of this condition include:  Fever and chills.  Fast heartbeat and shortness of breath.  Dizziness, weakness, and low blood pressure.  Confusion or anxiety.  Pain in the abdomen, nausea, vomiting, and diarrhea. Bacteremia that has  spread to other parts of the body may cause symptoms in those areas. In some cases, there are no symptoms. How is this diagnosed? This condition may be diagnosed with a physical exam and tests, such as:  Blood tests to check for bacteria (cultures) or other signs of infection.  Tests of any tubes that you have had inserted. These tests check for a source of infection.  Urine tests to check for bacteria in the urine.  Imaging tests, such as an X-ray, a CT scan, an MRI, or a heart ultrasound. These check for a source of infection in other parts of your body, such as your lungs, heart valves, or joints. How is this treated? This condition is usually treated in the hospital. If you are treated at home, you may need to return to the hospital for medicines, blood tests, and evaluation. Treatment may include:  Antibiotic medicines. These may be given by mouth or directly into your blood through an IV. You may need antibiotics for several weeks. At first, you may be given an antibiotic to kill most types of blood bacteria. If tests show that a certain kind of bacteria is causing the problem, you may be given a different antibiotic.  IV fluids.  Removing any catheter or device that could be a source of infection.  Blood pressure and breathing support, if needed.  Surgery to control the source or the spread of infection, such as surgery to remove an implanted device, abscess, or infected tissue. Follow these instructions at home: Medicines  Take over-the-counter and prescription medicines only as told by your health care provider.  If you were prescribed an antibiotic medicine, take it as told by your health care provider. Do not stop taking the antibiotic even if you start to feel better. General instructions   Rest as needed. Ask your health care provider when you may return to normal activities.  Drink enough fluid to keep your urine pale yellow.  Do not use any products that contain  nicotine or tobacco, such as cigarettes, e-cigarettes, and chewing tobacco. If you need help quitting, ask your health care provider.  Keep all follow-up visits as told by your health care provider. This is important. How is this prevented?   Wash your hands regularly with soap and water. If soap and water are not available, use hand sanitizer.  You should wash your hands: ? After using the toilet or changing a diaper. ? Before preparing, cooking, serving, or eating food. ? While caring for a sick person or while visiting someone in a hospital. ? Before and after changing bandages (dressings) over wounds.  Clean any scrapes or cuts with soap and water and cover them with a clean bandage.  Get vaccinations as recommended by your health care provider.  Practice good oral hygiene. Brush your teeth two times a day, and floss regularly.  Take good care of your skin. This includes bathing and moisturizing on a regular basis. Contact a health care provider if:  Your symptoms get worse, and medicines do not help.  You have severe pain. Get help right away if  you have:  Pain.  A fever or chills.  Trouble breathing.  A fast heart rate.  Skin that is blotchy, pale, or clammy.  Confusion.  Weakness.  Lack of energy or unusual sleepiness.  New symptoms that develop after treatment has started. These symptoms may represent a serious problem that is an emergency. Do not wait to see if the symptoms will go away. Get medical help right away. Call your local emergency services (911 in the [place] Do not drive yourself to the hospital. Summary  Bacteremia is the presence of bacteria in the blood. When bacteria enter the bloodstream, they can cause a life-threatening reaction called sepsis.  Bacteremia is usually treated with antibiotic medicines in the hospital.  If you were prescribed an antibiotic medicine, take it as told by your health care provider. Do not stop taking the  antibiotic even if you start to feel better.  Get help right away if you have any new symptoms that develop after treatment has started. This information is not intended to replace advice given to you by your health care provider. Make sure you discuss any questions you have with your health care provider. Document Revised: 10/31/2018 Document Reviewed: 10/31/2018 Elsevier Patient Education  2020 [employer].

## 2019-07-01 NOTE — Progress Notes (Signed)
Patient said she is leaving AMA at 8pm if the PICC line is in or not.  I let the doctor and case manager know.

## 2019-07-01 NOTE — TOC Initial Note (Addendum)
Transition of Care (TOC) - Initial/Assessment Note  Donn Pierini RN,BSN Transitions of Care Unit 4NP (non trauma) - RN Case Manager [phone]   Patient Details  Name: Lauren Brown MRN: [id number] Date of Birth: 06/21/1961  Transition of Care [facility]) CM/SW Contact:    Darrold Span, RN Phone Number: 07/01/2019, 3:57 PM  Clinical Narrative:                 Pt admitted with bacteremia- per ID will need home IV abx with planned end date for 2/1- OPAT orders have been placed- order for [facility]. PICC line pending placement- CM spoke with pt at bedside- list provided to pt for [facility] choice- Per CMS guidelines from medicare.gov website with star ratings (copy placed in shadow chart)- pt is agreeable to use [facility] for pharmacy needs along with [facility] for nursing as she states she does not have a preference. Pt is stating that she is leaving the hospital tonight with or without PICC line-as she is "tired of being here". Discussed with pt importance of PICC line for appropriate treatment based on MD recommendations and pt is still adamant that she is leaving tonight at 8pm regardless if PICC has been placed or not. Call made to [facility] with [facility]- to discuss referral for home IV abx needs- they can provide needed services and do teaching this afternoon at the bedside- Pam has reached out to [facility] for [facility] needs and confirmed they can see pt tomorrow 1/7 at 2pm. CM has notified MD and bedside RN that home IV abx arrangements are in place for pt pending PICC line placement- hopeful that PICC can be placed prior to 8pm so pt can be discharged with appropriate plans in place.  Address and phone # confirmed with pt in epic- per pt her PCP is Dr. Elmer Ramp.  Expected Discharge Plan: Home w Home Health Services Barriers to Discharge: Other (comment)(awaiting PICC line placement)   Patient Goals and CMS Choice Patient states their goals for this hospitalization and ongoing recovery are:: I want to go  home CMS Medicare.gov Compare Post Acute Care list provided to:: Patient Choice offered to / list presented to : Patient  Expected Discharge Plan and Services Expected Discharge Plan: Home w Home Health Services   Discharge Planning Services: CM Consult Post Acute Care Choice: Home Health Living arrangements for the past 2 months: Single Family Home                 DME Arranged: N/A DME Agency: NA       HH Arranged: RN, IV Antibiotics HH Agency: [facility] Date HH Agency Contacted: 07/01/19 Time HH Agency Contacted: 1500 Representative spoke with at [facility] Agency: Pam  Prior Living Arrangements/Services Living arrangements for the past 2 months: Single Family Home Lives with:: Spouse Patient language and need for interpreter reviewed:: Yes Do you feel safe going back to the place where you live?: Yes      Need for Family Participation in Patient Care: Yes (Comment) Care giver support system in place?: Yes (comment)   Criminal Activity/Legal Involvement Pertinent to Current Situation/Hospitalization: No - Comment as needed  Activities of Daily Living      Permission Sought/Granted Permission sought to share information with : Facility Medical sales representative, Case Automotive engineer granted to share info w AGENCY: [facility]        Emotional Assessment Appearance:: Appears stated age Attitude/Demeanor/Rapport: Engaged Affect (typically observed): Blunt Orientation: : Oriented  to Self, Oriented to Place, Oriented to  Time, Oriented to Situation   Psych Involvement: No (comment)  Admission diagnosis:  Altered mental status [R41.82] Disorientation [R41.0] Patient Active Problem List   Diagnosis Date Noted  . Acute metabolic encephalopathy 09/73/5329  . Bacteremia due to methicillin susceptible Staphylococcus aureus (MSSA) 06/29/2019  . Leg wound, left 06/29/2019  . Hyperglycemia 06/29/2019  . PVD (peripheral vascular disease) ([place]) 06/27/2019  .  Essential hypertension 06/27/2019  . Hyperlipidemia 06/27/2019   PCP:  Patient, No Pcp Per Pharmacy:   [facility] [address] [place] [address] Phone: [phone] Fax: [phone]     Social Determinants of Health (SDOH) Interventions    Readmission Risk Interventions Readmission Risk Prevention Plan 07/01/2019  Post Dischage Appt Complete  Medication Screening Complete  Transportation Screening Complete  Some recent data might be hidden

## 2019-07-02 SURGERY — ECHOCARDIOGRAM, TRANSESOPHAGEAL
Anesthesia: Moderate Sedation

## 2019-07-04 LAB — CULTURE, BLOOD (ROUTINE X 2)
Culture: NO GROWTH
Culture: NO GROWTH

## 2019-07-05 ENCOUNTER — Encounter: Payer: Self-pay | Admitting: Internal Medicine

## 2019-07-06 ENCOUNTER — Other Ambulatory Visit: Payer: Self-pay

## 2019-07-06 ENCOUNTER — Other Ambulatory Visit (HOSPITAL_COMMUNITY)
Admit: 2019-07-06 | Discharge: 2019-07-06 | Disposition: A | Discharge status: Home / Self Care | Payer: BC Managed Care – PPO | Attending: Cardiology | Admitting: Cardiology

## 2019-07-07 ENCOUNTER — Ambulatory Visit (HOSPITAL_COMMUNITY): Admission: RE | Admit: 2019-07-07 | Payer: BC Managed Care – PPO | Source: Home / Self Care | Admitting: Cardiology

## 2019-07-07 ENCOUNTER — Encounter (HOSPITAL_COMMUNITY): Admission: RE | Payer: Self-pay | Source: Home / Self Care

## 2019-07-07 SURGERY — ECHOCARDIOGRAM, TRANSESOPHAGEAL
Anesthesia: Moderate Sedation

## 2019-07-12 ENCOUNTER — Other Ambulatory Visit (HOSPITAL_COMMUNITY)
Admission: RE | Admit: 2019-07-12 | Discharge: 2019-07-12 | Disposition: A | Discharge status: Home / Self Care | Payer: BC Managed Care – PPO | Source: Other Acute Inpatient Hospital | Attending: Internal Medicine | Admitting: Internal Medicine

## 2019-07-12 DIAGNOSIS — A4901 Methicillin susceptible Staphylococcus aureus infection, unspecified site: Secondary | ICD-10-CM | POA: Diagnosis not present

## 2019-07-12 LAB — BASIC METABOLIC PANEL
Anion gap: 8 (ref 5–15)
BUN: 8 mg/dL (ref 6–20)
CO2: 26 mmol/L (ref 22–32)
Calcium: 8.7 mg/dL — ABNORMAL LOW (ref 8.9–10.3)
Chloride: 105 mmol/L (ref 98–111)
Creatinine, Ser: 0.7 mg/dL (ref 0.44–1.00)
GFR calc Af Amer: >60 mL/min (ref >60)
GFR calc non Af Amer: >60 mL/min (ref >60)
Glucose, Bld: 90 mg/dL (ref 70–99)
Potassium: 4.2 mmol/L (ref 3.5–5.1)
Sodium: 139 mmol/L (ref 135–145)

## 2019-07-12 LAB — CBC WITH DIFFERENTIAL/PLATELET
Abs Immature Granulocytes: 0.03 10*3/uL (ref 0.00–0.07)
Basophils Absolute: 0 10*3/uL (ref 0.0–0.1)
Basophils Relative: 0 %
Eosinophils Absolute: 0.2 10*3/uL (ref 0.0–0.5)
Eosinophils Relative: 2 %
HCT: 32.5 % — ABNORMAL LOW (ref 36.0–46.0)
Hemoglobin: 9.9 g/dL — ABNORMAL LOW (ref 12.0–15.0)
Immature Granulocytes: 0 %
Lymphocytes Relative: 22 %
Lymphs Abs: 2 10*3/uL (ref 0.7–4.0)
MCH: 26.9 pg (ref 26.0–34.0)
MCHC: 30.5 g/dL (ref 30.0–36.0)
MCV: 88.3 fL (ref 80.0–100.0)
Monocytes Absolute: 0.5 10*3/uL (ref 0.1–1.0)
Monocytes Relative: 5 %
Neutro Abs: 6.5 10*3/uL (ref 1.7–7.7)
Neutrophils Relative %: 71 %
Platelets: 531 10*3/uL — ABNORMAL HIGH (ref 150–400)
RBC: 3.68 MIL/uL — ABNORMAL LOW (ref 3.87–5.11)
RDW: 13.2 % (ref 11.5–15.5)
WBC: 9.1 10*3/uL (ref 4.0–10.5)
nRBC: 0 % (ref 0.0–0.2)

## 2019-07-13 ENCOUNTER — Telehealth: Payer: Self-pay

## 2019-07-13 ENCOUNTER — Other Ambulatory Visit (HOSPITAL_COMMUNITY)
Admission: RE | Admit: 2019-07-13 | Discharge: 2019-07-13 | Disposition: A | Discharge status: Home / Self Care | Payer: BC Managed Care – PPO | Source: Ambulatory Visit | Attending: Cardiovascular Disease | Admitting: Cardiovascular Disease

## 2019-07-13 DIAGNOSIS — Z01812 Encounter for preprocedural laboratory examination: Secondary | ICD-10-CM | POA: Diagnosis not present

## 2019-07-13 DIAGNOSIS — Z20822 Contact with and (suspected) exposure to covid-19: Secondary | ICD-10-CM | POA: Diagnosis not present

## 2019-07-13 LAB — SARS CORONAVIRUS 2 (TAT 6-24 HRS): SARS Coronavirus 2: NEGATIVE

## 2019-07-13 NOTE — Telephone Encounter (Signed)
COVID-19 Pre-Screening Questions:07/13/19  Do you currently have a fever (>100 F), chills or unexplained body aches? NO   . Are you currently experiencing new cough, shortness of breath, sore throat, runny nose? NO .  Have you recently travelled outside the state of [place] in the last 14 days? NO .  Have you been in contact with someone that is currently pending confirmation of Covid19 testing or has been confirmed to have the Covid19 virus?  NO  **If the patient answers NO to ALL questions -  advise the patient to please call the clinic before coming to the office should any symptoms develop.     

## 2019-07-14 ENCOUNTER — Encounter: Payer: Self-pay | Admitting: Internal Medicine

## 2019-07-14 ENCOUNTER — Other Ambulatory Visit: Payer: Self-pay

## 2019-07-14 ENCOUNTER — Ambulatory Visit (INDEPENDENT_AMBULATORY_CARE_PROVIDER_SITE_OTHER): Payer: BC Managed Care – PPO | Admitting: Internal Medicine

## 2019-07-14 DIAGNOSIS — R7881 Bacteremia: Secondary | ICD-10-CM

## 2019-07-14 DIAGNOSIS — S81802D Unspecified open wound, left lower leg, subsequent encounter: Secondary | ICD-10-CM

## 2019-07-14 DIAGNOSIS — B9561 Methicillin susceptible Staphylococcus aureus infection as the cause of diseases classified elsewhere: Secondary | ICD-10-CM

## 2019-07-14 NOTE — Progress Notes (Addendum)
[place] for Infectious Disease  Patient Active Problem List   Diagnosis Date Noted  . Bacteremia due to methicillin susceptible Staphylococcus aureus (MSSA) 06/29/2019    Priority: High  . Leg wound, left 06/29/2019    Priority: High  . Acute metabolic encephalopathy 34/37/3578  . Hyperglycemia 06/29/2019  . PVD (peripheral vascular disease) ([place]) 06/27/2019  . Essential hypertension 06/27/2019  . Hyperlipidemia 06/27/2019    Patient's Medications  New Prescriptions   No medications on file  Previous Medications   ASPIRIN 81 MG EC TABLET    Take 81 mg by mouth daily.    AUGMENTED BETAMETHASONE DIPROPIONATE (DIPROLENE-AF) 0.05 % CREAM    Apply 1 application topically 2 (two) times daily. Applied to hands (Not to be applied to the face, groin, or underarm areas)   CLOPIDOGREL (PLAVIX) 75 MG TABLET    Take 75 mg by mouth daily.   SANTYL OINTMENT    Apply 1 application topically See admin instructions. Apply to wound of leg leg with each dressing change as directed   SIMVASTATIN (ZOCOR) 20 MG TABLET    Take 20 mg by mouth at bedtime.   TRIAMCINOLONE CREAM (KENALOG) 0.1 %    Apply 1 application topically 2 (two) times daily.    VITAMIN B-12 (CYANOCOBALAMIN) 500 MCG TABLET    Take 1 tablet (500 mcg total) by mouth daily.  Modified Medications   No medications on file  Discontinued Medications   CEFAZOLIN (ANCEF) IVPB    Inject 2 g into the vein every 8 (eight) hours. Indication:  MSSA bacteremia Last Day of Therapy:  07/26/18 Labs - Once weekly:  CBC/D and BMP, Labs - Every other week:  ESR and CRP    Subjective: Lauren Brown is in for her hospital follow-up visit.  She has a history of peripheral vascular disease and recent limb threatening ischemia.  She underwent left iliac artery stenting on 06/24/2019.  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  Cultures of the wound last October grew MSSA.  She recalls that her wound was  starting to get a little bit better after her recent stenting but said that she still had pink, bloody drainage with a foul odor.  She became acutely confused and was taken to the [facility] emergency department on 06/27/2019 where she was noted to have a temperature of 101 degrees.  Testing for COVID-19 was negative.  Because of concerns for acute meningoencephalitis she was started on broad empiric antimicrobial therapy. Notes indicate that her confusion cleared rapidly.  She was transferred here last evening for possible lumbar puncture and evaluation by neurology.  1 blood culture was obtained upon admission and it grew MSSA.  MRI of her left leg showed the soft tissue defect associated with her chronic wound but no underlying osteomyelitis or abscess.  Repeat blood cultures were rapidly negative.  She had no evidence for endocarditis by exam or TTE.  She was discharged on IV cefazolin and has now completed 18 days of therapy.  She is scheduled for TEE tomorrow as an outpatient.  She has not had any problems tolerating her PICC or cefazolin.  Review of Systems: Review of Systems  Constitutional: Negative for chills, diaphoresis and fever.  Respiratory: Negative for cough and shortness of breath.   Cardiovascular: Negative for chest pain.  Gastrointestinal: Negative for abdominal pain, diarrhea, nausea and vomiting.    Past Medical History:  Diagnosis Date  . Eczema   . Hyperlipidemia   . Hypertension   . Peripheral vascular disease ([place])     Social History   Tobacco Use  . Smoking status: Current Every Day Smoker  . Smokeless tobacco: Never Used  Substance Use Topics  . Alcohol use: Never  . Drug use: Never    Family History  Problem Relation Age of Onset  . Hypertension Mother   . Hypertension Father     No Known Allergies  Objective: Vitals:   07/14/19 1612  BP: 127/77  Pulse: 67  Weight: 189 lb 3.2 oz (85.8 kg)   Body mass index is 29.63 kg/m.  Physical  Exam Constitutional:      Comments: She is very calm and pleasant.  Cardiovascular:     Rate and Rhythm: Normal rate and regular rhythm.     Heart sounds: No murmur.  Pulmonary:     Effort: Pulmonary effort is normal.     Breath sounds: Normal breath sounds.  Musculoskeletal:     Comments: Her chronic wound on her left lower shin looks good without any surrounding cellulitis.  The wound does not look infected.  Skin:    Comments: Her right arm PICC site looks good.  Psychiatric:        Mood and Affect: Mood normal.     Lab Results    Problem List Items Addressed This Visit      High   Leg wound, left    I instructed her to resume follow-up visits to the wound center in [place], [place].      Bacteremia due to methicillin susceptible Staphylococcus aureus (MSSA)    If there is any evidence of endocarditis on TEE tomorrow I will extend her IV cefazolin to 6 full weeks.  If the procedure is negative I will discontinue cefazolin and have her PICC removed.  Addendum: There was no evidence of endocarditis on TEE.  I let Lauren Brown know that I will give orders to [place] to stop cefazolin now and have the PICC removed as soon as possible.  She knows to call me if she has any signs or symptoms to suggest relapse in the coming 6 weeks.          Michel Bickers, MD [facility] [phone] pager   [phone] cell 07/15/2019, 12:57 PM

## 2019-07-14 NOTE — Assessment & Plan Note (Addendum)
If there is any evidence of endocarditis on TEE tomorrow I will extend her IV cefazolin to 6 full weeks.  If the procedure is negative I will discontinue cefazolin and have her PICC removed.  Addendum: There was no evidence of endocarditis on TEE.  I let Lauren Brown know that I will give orders to [facility] to stop cefazolin now and have the PICC removed as soon as possible.  She knows to call me if she has any signs or symptoms to suggest relapse in the coming 6 weeks.

## 2019-07-14 NOTE — H&P (View-Only) (Signed)
[place] for Infectious Disease  Patient Active Problem List   Diagnosis Date Noted  . Bacteremia due to methicillin susceptible Staphylococcus aureus (MSSA) 06/29/2019    Priority: High  . Leg wound, left 06/29/2019    Priority: High  . Acute metabolic encephalopathy 98/92/1194  . Hyperglycemia 06/29/2019  . PVD (peripheral vascular disease) ([place]) 06/27/2019  . Essential hypertension 06/27/2019  . Hyperlipidemia 06/27/2019    Patient's Medications  New Prescriptions   No medications on file  Previous Medications   ASPIRIN 81 MG EC TABLET    Take 81 mg by mouth daily.    AUGMENTED BETAMETHASONE DIPROPIONATE (DIPROLENE-AF) 0.05 % CREAM    Apply 1 application topically 2 (two) times daily. Applied to hands (Not to be applied to the face, groin, or underarm areas)   CEFAZOLIN (ANCEF) IVPB    Inject 2 g into the vein every 8 (eight) hours. Indication:  MSSA bacteremia Last Day of Therapy:  07/26/18 Labs - Once weekly:  CBC/D and BMP, Labs - Every other week:  ESR and CRP   CLOPIDOGREL (PLAVIX) 75 MG TABLET    Take 75 mg by mouth daily.   SANTYL OINTMENT    Apply 1 application topically See admin instructions. Apply to wound of leg leg with each dressing change as directed   SIMVASTATIN (ZOCOR) 20 MG TABLET    Take 20 mg by mouth at bedtime.   TRIAMCINOLONE CREAM (KENALOG) 0.1 %    Apply 1 application topically 2 (two) times daily.    VITAMIN B-12 (CYANOCOBALAMIN) 500 MCG TABLET    Take 1 tablet (500 mcg total) by mouth daily.  Modified Medications   No medications on file  Discontinued Medications   No medications on file    Subjective: Lauren Brown is in for her hospital follow-up visit.  She has a history of peripheral vascular disease and recent limb threatening ischemia.  She underwent left iliac artery stenting on 06/24/2019.  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  Cultures of the wound last October grew MSSA.  She  recalls that her wound was starting to get a little bit better after her recent stenting but said that she still had pink, bloody drainage with a foul odor.  She became acutely confused and was taken to the [facility] emergency department on 06/27/2019 where she was noted to have a temperature of 101 degrees.  Testing for COVID-19 was negative.  Because of concerns for acute meningoencephalitis she was started on broad empiric antimicrobial therapy. Notes indicate that her confusion cleared rapidly.  She was transferred here last evening for possible lumbar puncture and evaluation by neurology.  1 blood culture was obtained upon admission and it grew MSSA.  MRI of her left leg showed the soft tissue defect associated with her chronic wound but no underlying osteomyelitis or abscess.  Repeat blood cultures were rapidly negative.  She had no evidence for endocarditis by exam or TTE.  She was discharged on IV cefazolin and has now completed 18 days of therapy.  She is scheduled for TEE tomorrow as an outpatient.  She has not had any problems tolerating her PICC or cefazolin.  Review of Systems: Review of Systems  Constitutional: Negative for chills, diaphoresis and fever.  Respiratory: Negative for cough and shortness of breath.   Cardiovascular: Negative for chest pain.  Gastrointestinal: Negative for abdominal pain, diarrhea, nausea and vomiting.  Past Medical History:  Diagnosis Date  . Eczema   . Hyperlipidemia   . Hypertension   . Peripheral vascular disease ([place])     Social History   Tobacco Use  . Smoking status: Current Every Day Smoker  . Smokeless tobacco: Never Used  Substance Use Topics  . Alcohol use: Never  . Drug use: Never    Family History  Problem Relation Age of Onset  . Hypertension Mother   . Hypertension Father     No Known Allergies  Objective: Vitals:   07/14/19 1612  BP: 127/77  Pulse: 67  Weight: 189 lb 3.2 oz (85.8 kg)   Body mass index is 29.63  kg/m.  Physical Exam Constitutional:      Comments: She is very calm and pleasant.  Cardiovascular:     Rate and Rhythm: Normal rate and regular rhythm.     Heart sounds: No murmur.  Pulmonary:     Effort: Pulmonary effort is normal.     Breath sounds: Normal breath sounds.  Musculoskeletal:     Comments: Her chronic wound on her left lower shin looks good without any surrounding cellulitis.  The wound does not look infected.  Skin:    Comments: Her right arm PICC site looks good.  Psychiatric:        Mood and Affect: Mood normal.     Lab Results    Problem List Items Addressed This Visit      High   Leg wound, left    I instructed her to resume follow-up visits to the wound center in [place], [place].      Bacteremia due to methicillin susceptible Staphylococcus aureus (MSSA)    If there is any evidence of endocarditis on TEE tomorrow I will extend her IV cefazolin to 6 full weeks.  If the procedure is negative I will discontinue cefazolin and have her PICC removed.          Michel Bickers, MD [facility] for [facility] [phone] pager   [phone] cell 07/14/2019, 5:08 PM

## 2019-07-14 NOTE — Assessment & Plan Note (Signed)
I instructed her to resume follow-up visits to the wound center in [place], [place].

## 2019-07-15 ENCOUNTER — Encounter (HOSPITAL_COMMUNITY): Payer: Self-pay | Admitting: Cardiovascular Disease

## 2019-07-15 ENCOUNTER — Ambulatory Visit (HOSPITAL_COMMUNITY)
Admission: RE | Admit: 2019-07-15 | Discharge: 2019-07-15 | Disposition: A | Discharge status: Home / Self Care | Payer: BC Managed Care – PPO | Attending: Cardiovascular Disease | Admitting: Cardiovascular Disease

## 2019-07-15 ENCOUNTER — Other Ambulatory Visit: Payer: Self-pay

## 2019-07-15 ENCOUNTER — Telehealth: Payer: Self-pay

## 2019-07-15 ENCOUNTER — Ambulatory Visit (HOSPITAL_COMMUNITY): Payer: BC Managed Care – PPO

## 2019-07-15 ENCOUNTER — Ambulatory Visit (HOSPITAL_BASED_OUTPATIENT_CLINIC_OR_DEPARTMENT_OTHER): Payer: BC Managed Care – PPO

## 2019-07-15 ENCOUNTER — Encounter (HOSPITAL_COMMUNITY)
Admission: RE | Disposition: A | Discharge status: Home / Self Care | Payer: Self-pay | Source: Home / Self Care | Attending: Cardiovascular Disease

## 2019-07-15 DIAGNOSIS — R7881 Bacteremia: Secondary | ICD-10-CM

## 2019-07-15 DIAGNOSIS — Z79899 Other long term (current) drug therapy: Secondary | ICD-10-CM | POA: Insufficient documentation

## 2019-07-15 DIAGNOSIS — Z7982 Long term (current) use of aspirin: Secondary | ICD-10-CM | POA: Diagnosis not present

## 2019-07-15 DIAGNOSIS — I739 Peripheral vascular disease, unspecified: Secondary | ICD-10-CM | POA: Insufficient documentation

## 2019-07-15 DIAGNOSIS — R739 Hyperglycemia, unspecified: Secondary | ICD-10-CM | POA: Insufficient documentation

## 2019-07-15 DIAGNOSIS — F17200 Nicotine dependence, unspecified, uncomplicated: Secondary | ICD-10-CM | POA: Insufficient documentation

## 2019-07-15 DIAGNOSIS — E785 Hyperlipidemia, unspecified: Secondary | ICD-10-CM | POA: Insufficient documentation

## 2019-07-15 DIAGNOSIS — I10 Essential (primary) hypertension: Secondary | ICD-10-CM | POA: Insufficient documentation

## 2019-07-15 DIAGNOSIS — Z7902 Long term (current) use of antithrombotics/antiplatelets: Secondary | ICD-10-CM | POA: Diagnosis not present

## 2019-07-15 HISTORY — PX: TEE WITHOUT CARDIOVERSION: SHX5443

## 2019-07-15 SURGERY — ECHOCARDIOGRAM, TRANSESOPHAGEAL
Anesthesia: Monitor Anesthesia Care

## 2019-07-15 MED ORDER — LIDOCAINE 2% (20 MG/ML) 5 ML SYRINGE
INTRAMUSCULAR | Status: DC | PRN
  Administered 2019-07-15 (×2): 40 mg via INTRAVENOUS

## 2019-07-15 MED ORDER — PROPOFOL 500 MG/50ML IV EMUL
INTRAVENOUS | Status: DC | PRN
  Administered 2019-07-15: 100 ug/kg/min via INTRAVENOUS

## 2019-07-15 MED ORDER — SODIUM CHLORIDE 0.9 % IV SOLN: INTRAVENOUS | Status: DC

## 2019-07-15 MED ORDER — PROPOFOL 10 MG/ML IV BOLUS
INTRAVENOUS | Status: DC | PRN
  Administered 2019-07-15 (×3): 20 mg via INTRAVENOUS

## 2019-07-15 NOTE — Anesthesia Procedure Notes (Signed)
Procedure Name: MAC Date/Time: 07/15/2019 8:55 AM Performed by: Janene Harvey, CRNA Pre-anesthesia Checklist: Patient identified, Emergency Drugs available, Suction available and Patient being monitored Patient Re-evaluated:Patient Re-evaluated prior to induction Oxygen Delivery Method: Nasal cannula Dental Injury: Teeth and Oropharynx as per pre-operative assessment

## 2019-07-15 NOTE — Telephone Encounter (Signed)
Per Dr. Orvan Falconer, ok to discontinue cefazolin and pull PICC. Orders relayed to [facility].   Leilani Cespedes Loyola Mast, RN

## 2019-07-15 NOTE — Anesthesia Preprocedure Evaluation (Addendum)
Anesthesia Evaluation  Patient identified by MRN, date of birth, ID band Patient awake    Reviewed: Allergy & Precautions, NPO status , Patient's Chart, lab work & pertinent test results  Airway Mallampati: I  TM Distance: >3 FB Neck ROM: Full    Dental  (+) Upper Dentures, Lower Dentures   Pulmonary Current Smoker and Patient abstained from smoking.,    breath sounds clear to auscultation       Cardiovascular hypertension, + Peripheral Vascular Disease   Rhythm:Regular Rate:Normal     Neuro/Psych negative neurological ROS  negative psych ROS   GI/Hepatic negative GI ROS, Neg liver ROS,   Endo/Other  negative endocrine ROS  Renal/GU negative Renal ROS     Musculoskeletal negative musculoskeletal ROS (+)   Abdominal Normal abdominal exam  (+)   Peds  Hematology negative hematology ROS (+)   Anesthesia Other Findings   Reproductive/Obstetrics                            Anesthesia Physical Anesthesia Plan  ASA: II  Anesthesia Plan: MAC   Post-op Pain Management:    Induction: Intravenous  PONV Risk Score and Plan: 0 and Propofol infusion  Airway Management Planned: Natural Airway and Nasal Cannula  Additional Equipment: None  Intra-op Plan:   Post-operative Plan:   Informed Consent: I have reviewed the patients History and Physical, chart, labs and discussed the procedure including the risks, benefits and alternatives for the proposed anesthesia with the patient or authorized representative who has indicated his/her understanding and acceptance.       Plan Discussed with: CRNA  Anesthesia Plan Comments:         Anesthesia Quick Evaluation

## 2019-07-15 NOTE — Addendum Note (Signed)
Addended by: Cliffton Asters on: 07/15/2019 12:58 PM   Modules accepted: Orders

## 2019-07-15 NOTE — Interval H&P Note (Signed)
History and Physical Interval Note:  07/15/2019 8:15 AM  Lauren Brown  has presented today for surgery, with the diagnosis of bacteremia.  The various methods of treatment have been discussed with the patient and family. After consideration of risks, benefits and other options for treatment, the patient has consented to  Procedure(s): TRANSESOPHAGEAL ECHOCARDIOGRAM (TEE) (N/A) as a surgical intervention.  The patient's history has been reviewed, patient examined, no change in status, stable for surgery.  I have reviewed the patient's chart and labs.  Questions were answered to the patient's satisfaction.    TEE to exclude endocarditis in setting of MSSA bacteremia. Labs reviewed. No issues with teeth (dentures), swallowing, or stomach bleeding. No issues with anesthesia. NPO since midnight. Will proceed with TEE.   Gerri Spore T. Flora Lipps, MD [facility]  [address] [place], [place] [address] [phone]  8:16 AM

## 2019-07-15 NOTE — Anesthesia Postprocedure Evaluation (Signed)
Anesthesia Post Note  Patient: Wyoma Genson  Procedure(s) Performed: TRANSESOPHAGEAL ECHOCARDIOGRAM (TEE) (N/A )     Patient location during evaluation: PACU Anesthesia Type: MAC Level of consciousness: awake and alert Pain management: pain level controlled Vital Signs Assessment: post-procedure vital signs reviewed and stable Respiratory status: spontaneous breathing, nonlabored ventilation, respiratory function stable and patient connected to nasal cannula oxygen Cardiovascular status: stable and blood pressure returned to baseline Postop Assessment: no apparent nausea or vomiting Anesthetic complications: no    Last Vitals:  Vitals:   07/15/19 0920 07/15/19 0940  BP: (!) 123/56 (!) 143/77  Pulse: 61 (!) 53  Resp: (!) 22 19  Temp: (!) 36.3 C   SpO2: 99% 100%    Last Pain:  Vitals:   07/15/19 0920  TempSrc: Temporal  PainSc: 0-No pain                 Effie Berkshire

## 2019-07-15 NOTE — CV Procedure (Signed)
    TRANSESOPHAGEAL ECHOCARDIOGRAM   NAME:  Lauren Brown    MRN: [id number] DOB:  04-15-1961    ADMIT DATE: 07/15/2019  INDICATIONS: MSSA Bacteremia   PROCEDURE:   Informed consent was obtained prior to the procedure. The risks, benefits and alternatives for the procedure were discussed and the patient comprehended these risks.  Risks include, but are not limited to, cough, sore throat, vomiting, nausea, somnolence, esophageal and stomach trauma or perforation, bleeding, low blood pressure, aspiration, pneumonia, infection, trauma to the teeth and death.    Procedural time out performed. The oropharynx was anesthetized with topical 1% benzocaine.    During this procedure anesthesia was monitored by Dr. Hart [place] to maintain moderate conscious sedation.  The patient's heart rate, blood pressure, and oxygen saturation are monitored continuously during the procedure. The period of conscious sedation is 25 minutes, of which I was present face-to-face 100% of this time.   The transesophageal probe was inserted in the esophagus and stomach without difficulty and multiple views were obtained.   COMPLICATIONS:    There were no immediate complications.  KEY FINDINGS: 1. No evidence of infective endocarditis. 2. No significant valvular heart disease.  3. Normal LVEF ~60%.  Full report to follow. Further management per primary team.   Gerri Spore T. Flora Lipps, MD [facility]  [address] [place], [place] [address] [phone]  9:13 AM

## 2019-07-15 NOTE — Progress Notes (Signed)
  Echocardiogram Echocardiogram Transesophageal has been performed.  Adine Madura 07/15/2019, 9:25 AM

## 2019-07-15 NOTE — Transfer of Care (Signed)
Immediate Anesthesia Transfer of Care Note  Patient: Lauren Brown  Procedure(s) Performed: TRANSESOPHAGEAL ECHOCARDIOGRAM (TEE) (N/A )  Patient Location: Endoscopy Unit  Anesthesia Type:MAC  Level of Consciousness: drowsy  Airway & Oxygen Therapy: Patient Spontanous Breathing and Patient connected to nasal cannula oxygen  Post-op Assessment: Report given to RN and Post -op Vital signs reviewed and stable  Post vital signs: Reviewed  Last Vitals:  Vitals Value Taken Time  BP 123/56 07/15/19 0920  Temp    Pulse 59 07/15/19 0921  Resp 22 07/15/19 0921  SpO2 99 % 07/15/19 0921  Vitals shown include unvalidated device data.  Last Pain:  Vitals:   07/15/19 0808  TempSrc: Oral  PainSc: 0-No pain         Complications: No apparent anesthesia complications

## 2019-12-08 ENCOUNTER — Encounter (HOSPITAL_BASED_OUTPATIENT_CLINIC_OR_DEPARTMENT_OTHER): Payer: BC Managed Care – PPO | Attending: Internal Medicine | Admitting: Internal Medicine

## 2019-12-08 ENCOUNTER — Other Ambulatory Visit: Payer: Self-pay

## 2019-12-08 DIAGNOSIS — L97812 Non-pressure chronic ulcer of other part of right lower leg with fat layer exposed: Secondary | ICD-10-CM | POA: Insufficient documentation

## 2019-12-08 DIAGNOSIS — L97422 Non-pressure chronic ulcer of left heel and midfoot with fat layer exposed: Secondary | ICD-10-CM | POA: Diagnosis not present

## 2019-12-08 DIAGNOSIS — I70248 Atherosclerosis of native arteries of left leg with ulceration of other part of lower left leg: Secondary | ICD-10-CM | POA: Diagnosis not present

## 2019-12-08 DIAGNOSIS — F17210 Nicotine dependence, cigarettes, uncomplicated: Secondary | ICD-10-CM | POA: Insufficient documentation

## 2019-12-08 NOTE — Progress Notes (Signed)
AMERY, VANDENBOS ([id number]) Visit Report for 12/08/2019 Abuse/Suicide Risk Screen Details Patient Name: Date of Service: Lauren Brown RLENE 12/08/2019 2:45 PM Medical Record Number: [id number] Patient Account Number: [id number] Date of Birth/Sex: Treating RN: [birth year] (59 y.o. Lauren Brown Primary Care Keoshia Steinmetz: [name] Other Clinician: Referring Bryelle Spiewak: Treating Bently Morath/Extender: Anne Ng Weeks in Treatment: 0 Abuse/Suicide Risk Screen Items Answer ABUSE RISK SCREEN: Has anyone close to you tried to hurt or harm you recentlyo No Do you feel uncomfortable with anyone in your familyo No Has anyone forced you do things that you didnt want to doo No Electronic Signature(s) Signed: 12/08/2019 5:45:30 PM By: Zenaida Deed RN, BSN Entered By: Zenaida Deed on 12/08/2019 15:22:33 -------------------------------------------------------------------------------- Activities of Daily Living Details Patient Name: Date of Service: Lauren Brown RLENE 12/08/2019 2:45 PM Medical Record Number: [id number] Patient Account Number: [id number] Date of Birth/Sex: Treating RN: [birth year] (59 y.o. Lauren Brown Primary Care Haru Shaff: [name] Other Clinician: Referring Georga Stys: Treating Randall Colden/Extender: Anne Ng Weeks in Treatment: 0 Activities of Daily Living Items Answer Activities of Daily Living (Please select one for each item) Drive Automobile Completely Able T Medications ake Completely Able Use T elephone Completely Able Care for Appearance Completely Able Use T oilet Completely Able Bath / Shower Completely Able Dress Self Completely Able Feed Self Completely Able Walk Completely Able Get In / Out Bed Completely Able Housework Completely Able Prepare Meals Completely Able Handle Money Completely Able Shop for Self Completely Able Electronic Signature(s) Signed: 12/08/2019 5:45:30 PM By: Zenaida Deed RN, BSN Entered By:  Zenaida Deed on 12/08/2019 15:22:56 -------------------------------------------------------------------------------- Education Screening Details Patient Name: Date of Service: Lauren Brown RLENE 12/08/2019 2:45 PM Medical Record Number: [id number] Patient Account Number: [id number] Date of Birth/Sex: Treating RN: May 26, 1961 (59 y.o. Lauren Brown Primary Care Hymen Arnett: [name] Other Clinician: Referring Lillyanna Glandon: Treating Nichols Corter/Extender: Duncan Dull in Treatment: 0 Primary Learner Assessed: Patient Learning Preferences/Education Level/Primary Language Learning Preference: Explanation, Demonstration, Printed Material Highest Education Level: College or Above Preferred Language: English Cognitive Barrier Language Barrier: No Translator Needed: No Memory Deficit: No Emotional Barrier: No Cultural/Religious Beliefs Affecting Medical Care: No Physical Barrier Impaired Vision: No Impaired Hearing: No Decreased Hand dexterity: No Knowledge/Comprehension Knowledge Level: High Comprehension Level: High Ability to understand written instructions: High Ability to understand verbal instructions: High Motivation Anxiety Level: Calm Cooperation: Cooperative Education Importance: Acknowledges Need Interest in Health Problems: Asks Questions Perception: Coherent Willingness to Engage in Self-Management High Activities: Readiness to Engage in Self-Management High Activities: Electronic Signature(s) Signed: 12/08/2019 5:45:30 PM By: Zenaida Deed RN, BSN Entered By: Zenaida Deed on 12/08/2019 15:24:17 -------------------------------------------------------------------------------- Fall Risk Assessment Details Patient Name: Date of Service: [name] 12/08/2019 2:45 PM Medical Record Number: [id number] Patient Account Number: [id number] Date of Birth/Sex: Treating RN: 1961/05/07 (59 y.o. Lauren Brown Primary Care Oracio Galen: [name] Other Clinician: Referring Moranda Billiot: Treating Briyanna Billingham/Extender: Anne Ng Weeks in Treatment: 0 Fall Risk Assessment Items Have you had 2 or more falls in the last 12 monthso 0 No Have you had any fall that resulted in injury in the last 12 monthso 0 No FALLS RISK SCREEN History of falling - immediate or within 3 months 0 No Secondary diagnosis (Do you have 2 or more medical diagnoseso) 0 No Ambulatory aid None/bed rest/wheelchair/nurse 0 Yes Crutches/cane/walker 0 No Furniture 0 No Intravenous therapy Access/Saline/Heparin Lock 0 No Gait/Transferring Normal/ bed rest/ wheelchair 0 Yes Weak (short steps with  or without shuffle, stooped but able to lift head while walking, may seek 0 No support from furniture) Impaired (short steps with shuffle, may have difficulty arising from chair, head down, impaired 0 No balance) Mental Status Oriented to own ability 0 Yes Electronic Signature(s) Signed: 12/08/2019 5:45:30 PM By: Baruch Gouty RN, BSN Entered By: Baruch Gouty on 12/08/2019 15:24:27 -------------------------------------------------------------------------------- Foot Assessment Details Patient Name: Date of Service: Lauren Brown RLENE 12/08/2019 2:45 PM Medical Record Number: [id number] Patient Account Number: [id number] Date of Birth/Sex: Treating RN: [birth year] (59 y.o. Elam Dutch Primary Care Dontrae Morini: Jerene Bears Other Clinician: Referring Sidonie Dexheimer: Treating Seanmichael Salmons/Extender: Greta Doom Weeks in Treatment: 0 Foot Assessment Items Site Locations + = Sensation present, - = Sensation absent, C = Callus, U = Ulcer R = Redness, W = Warmth, M = Maceration, PU = Pre-ulcerative lesion F = Fissure, S = Swelling, D = Dryness Assessment Right: Left: Other Deformity: No No Prior Foot Ulcer: No No Prior Amputation: No No Charcot Joint: No No Ambulatory Status: Ambulatory Without Help Gait: Steady Electronic  Signature(s) Signed: 12/08/2019 5:45:30 PM By: Baruch Gouty RN, BSN Entered By: Baruch Gouty on 12/08/2019 15:25:54 -------------------------------------------------------------------------------- Nutrition Risk Screening Details Patient Name: Date of Service: Lauren Brown RLENE 12/08/2019 2:45 PM Medical Record Number: [id number] Patient Account Number: [id number] Date of Birth/Sex: Treating RN: 12-04-1960 (59 y.o. Elam Dutch Primary Care Arantxa Piercey: Jerene Bears Other Clinician: Referring Santa Abdelrahman: Treating Nickson Middlesworth/Extender: Ernestina Patches, Dhruv Weeks in Treatment: 0 Height (in): 66 Weight (lbs): 200 Body Mass Index (BMI): 32.3 Nutrition Risk Screening Items Score Screening NUTRITION RISK SCREEN: I have an illness or condition that made me change the kind and/or amount of food I eat 0 No I eat fewer than two meals per day 0 No I eat few fruits and vegetables, or milk products 0 No I have three or more drinks of beer, liquor or wine almost every day 0 No I have tooth or mouth problems that make it hard for me to eat 0 No I don't always have enough money to buy the food I need 0 No I eat alone most of the time 0 No I take three or more different prescribed or over-the-counter drugs a day 1 Yes Without wanting to, I have lost or gained 10 pounds in the last six months 0 No I am not always physically able to shop, cook and/or feed myself 0 No Nutrition Protocols Good Risk Protocol 0 No interventions needed Moderate Risk Protocol High Risk Proctocol Risk Level: Good Risk Score: 1 Electronic Signature(s) Signed: 12/08/2019 5:45:30 PM By: Baruch Gouty RN, BSN Entered By: Baruch Gouty on 12/08/2019 15:24:49

## 2019-12-09 NOTE — Progress Notes (Signed)
Lauren, Brown ([id number]) Visit Report for 12/08/2019 Allergy List Details Patient Name: Date of Service: Lauren Brown Lauren Brown 12/08/2019 2:45 PM Medical Record Number: [id number] Patient Account Number: [id number] Date of Birth/Sex: Treating RN: [birth year] (59 y.o. Lauren Brown Primary Care Lauren Brown: Lauren Brown Other Clinician: Referring Lauren Brown: Treating Lauren Brown/Extender: Lauren Brown, Lauren Brown in Treatment: 0 Allergies Active Allergies No Known Allergies Allergy Notes Electronic Signature(s) Signed: 12/08/2019 5:45:30 PM By: Zenaida Deed RN, BSN Entered By: Zenaida Deed on 12/08/2019 15:06:02 -------------------------------------------------------------------------------- Arrival Information Details Patient Name: Date of Service: Lauren Brown Lauren Brown 12/08/2019 2:45 PM Medical Record Number: [id number] Patient Account Number: [id number] Date of Birth/Sex: Treating RN: July 08, 1960 (59 y.o. Lauren Brown Primary Care Lauren Brown: Lauren Brown Other Clinician: Referring Lauren Brown: Treating Lauren Brown/Extender: Lauren Brown Brown in Treatment: 0 Visit Information Patient Arrived: Ambulatory Arrival Time: 14:43 Accompanied By: self Transfer Assistance: None Patient Identification Verified: Yes Secondary Verification Process Completed: Yes Patient Requires Transmission-Based Precautions: No Patient Has Alerts: Yes Patient Alerts: L ABI 1.01 Electronic Signature(s) Signed: 12/08/2019 5:45:30 PM By: Zenaida Deed RN, BSN Entered By: Zenaida Deed on 12/08/2019 15:34:15 -------------------------------------------------------------------------------- Clinic Level of Care Assessment Details Patient Name: Date of Service: Lauren Brown Lauren Brown 12/08/2019 2:45 PM Medical Record Number: [id number] Patient Account Number: [id number] Date of Birth/Sex: Treating RN: August 09, 1960 (59 y.o. Lauren Brown Primary Care Lauren Brown: Lauren Brown Other  Clinician: Referring Lauren Brown: Treating Lauren Brown/Extender: Lauren Brown Brown in Treatment: 0 Clinic Level of Care Assessment Items TOOL 2 Quantity Score X- 1 0 Use when only an EandM is performed on the INITIAL visit ASSESSMENTS - Nursing Assessment / Reassessment X- 1 20 General Physical Exam (combine w/ comprehensive assessment (listed just below) when performed on new pt. evals) X- 1 25 Comprehensive Assessment (HX, ROS, Risk Assessments, Wounds Hx, etc.) ASSESSMENTS - Wound and Skin A ssessment / Reassessment []  - 0 Simple Wound Assessment / Reassessment - one wound X- 3 5 Complex Wound Assessment / Reassessment - multiple wounds []  - 0 Dermatologic / Skin Assessment (not related to wound area) ASSESSMENTS - Ostomy and/or Continence Assessment and Care []  - 0 Incontinence Assessment and Management []  - 0 Ostomy Care Assessment and Management (repouching, etc.) PROCESS - Coordination of Care X - Simple Patient / Family Education for ongoing care 1 15 []  - 0 Complex (extensive) Patient / Family Education for ongoing care X- 1 10 Staff obtains , Records, T Results / Process Orders est []  - 0 Staff telephones HHA, Nursing Homes / Clarify orders / etc []  - 0 Routine Transfer to another Facility (non-emergent condition) []  - 0 Routine Hospital Admission (non-emergent condition) []  - 0 New Admissions / / Ordering NPWT Apligraf, etc. , []  - 0 Emergency Hospital Admission (emergent condition) X- 1 10 Simple Discharge Coordination []  - 0 Complex (extensive) Discharge Coordination PROCESS - Special Needs []  - 0 Pediatric / Minor Patient Management []  - 0 Isolation Patient Management []  - 0 Hearing / Language / Visual special needs []  - 0 Assessment of Community assistance (transportation, D/C planning, etc.) []  - 0 Additional assistance / Altered mentation []  - 0 Support Surface(s) Assessment (bed, cushion, seat,  etc.) INTERVENTIONS - Wound Cleansing / Measurement X- 1 5 Wound Imaging (photographs - any number of wounds) []  - 0 Wound Tracing (instead of photographs) []  - 0 Simple Wound Measurement - one wound X- 3 5 Complex Wound Measurement - multiple wounds []  - 0 Simple Wound Cleansing - one wound  X- 3 5 Complex Wound Cleansing - multiple wounds INTERVENTIONS - Wound Dressings []  - 0 Small Wound Dressing one or multiple wounds []  - 0 Medium Wound Dressing one or multiple wounds X- 1 20 Large Wound Dressing one or multiple wounds []  - 0 Application of Medications - injection INTERVENTIONS - Miscellaneous []  - 0 External ear exam []  - 0 Specimen Collection (cultures, biopsies, blood, body fluids, etc.) []  - 0 Specimen(s) / Culture(s) sent or taken to Lab for analysis []  - 0 Patient Transfer (multiple staff / Lift / Similar devices) []  - 0 Simple Staple / Suture removal (25 or less) []  - 0 Complex Staple / Suture removal (26 or more) []  - 0 Hypo / Hyperglycemic Management (close monitor of Blood Glucose) X- 1 15 Ankle / Brachial Index (ABI) - do not check if billed separately Has the patient been seen at the hospital within the last three years: Yes Total Score: 165 Level Of Care: New/Established - Level 5 Electronic Signature(s) Signed: 12/09/2019 9:54:21 AM By: RN Entered By: on 12/08/2019 15:58:12 -------------------------------------------------------------------------------- Encounter Discharge Information Details Patient Name: Date of Service: [facility] Lauren Brown 12/08/2019 2:45 PM Medical Record Number: Patient Account Number: Date of Birth/Sex: Treating RN: Oct 06, 1960 (59 y.o. Lauren Brown Primary Care Keatyn Luck: Lauren Brown Other Clinician: Referring Sammy Cassar: Treating Kodah Maret/Extender: 12/10/2019 Brown in Treatment: 0 Encounter Discharge Information Items Discharge Condition:  Stable Ambulatory Status: Ambulatory Discharge Destination: Home Transportation: Private Auto Accompanied By: self Schedule Follow-up Appointment: Yes Clinical Summary of Care: Patient Declined Electronic Signature(s) Signed: 12/08/2019 5:39:21 PM By: [facility] Entered By: 12/10/2019 on 12/08/2019 16:19:27 -------------------------------------------------------------------------------- Lower Extremity Assessment Details Patient Name: Date of Service: [id number] Lauren Brown 12/08/2019 2:45 PM Medical Record Number: [id number] Patient Account Number: [employer] Date of Birth/Sex: Treating RN: [birth year] (59 y.o. 12/10/2019 Primary Care Alizandra Loh: Cherylin Mylar Other Clinician: Referring Bunny Kleist: Treating Moria Brophy/Extender: Cherylin Mylar Brown in Treatment: 0 Edema Assessment Assessed: [Left: No] [Right: No] E[Left: dema] [Right: :] Calf Left: Right: Point of Measurement: cm From Medial Instep 40.8 cm cm Ankle Left: Right: Point of Measurement: cm From Medial Instep 28 cm cm Vascular Assessment Pulses: Dorsalis Pedis Palpable: [Left:Yes] Electronic Signature(s) Signed: 12/08/2019 5:45:30 PM By: Lauren Coons RN, BSN Entered By: 12/10/2019 on 12/08/2019 15:26:36 -------------------------------------------------------------------------------- Multi Wound Chart Details Patient Name: Date of Service: [id number] Lauren Brown 12/08/2019 2:45 PM Medical Record Number: [id number] Patient Account Number: Lauren Brown Date of Birth/Sex: Treating RN: 1961/05/26 (59 y.o. 12/10/2019 Primary Care Manar Smalling: Zenaida Deed Other Clinician: Referring Lakashia Collison: Treating Leanord Thibeau/Extender: Zenaida Deed Brown in Treatment: 0 Vital Signs Height(in): 66 Pulse(bpm): 78 Weight(lbs): 200 Blood Pressure(mmHg): 150/85 Body Mass Index(BMI): 32 Temperature(F): 98.9 Respiratory Rate(breaths/min): 18 Photos: [1:No Photos Left, Anterior Lower Leg]  [2:No Photos Left, Distal, Anterior Lower Leg] [3:No Photos Left Achilles] Wound Location: [1:Gradually Appeared] [2:Gradually Appeared] [3:Gradually Appeared] Wounding Event: [1:Arterial Insufficiency Ulcer] [2:Arterial Insufficiency Ulcer] [3:Arterial Insufficiency Ulcer] Primary Etiology: [1:Venous Leg Ulcer] [2:Venous Leg Ulcer] [3:N/A] Secondary Etiology: [1:Peripheral Arterial Disease] [2:Peripheral Arterial Disease] [3:Peripheral Arterial Disease] Comorbid History: [1:02/24/2019] [2:02/24/2019] [3:09/24/2019] Date Acquired: [1:0] [2:0] [3:0] Brown of Treatment: [1:Open] [2:Open] [3:Open] Wound Status: [1:2.3x2.6x0.2] [2:1.1x2.1x0.1] [3:2.2x4.6x0.3] Measurements L x W x D (cm) [1:4.697] [2:1.814] [3:7.948] A (cm) : rea [1:0.939] [2:0.181] [3:2.384] Volume (cm) : [1:Full Thickness Without Exposed] [2:Full Thickness Without Exposed] [3:Full Thickness Without Exposed] Classification: [1:Support Structures Medium] [2:Support Structures Medium] [3:Support Structures  Medium] Exudate Amount: [1:Serosanguineous] [2:Serosanguineous] [3:Serosanguineous] Exudate Type: [1:red, brown] [2:red, brown] [3:red, brown] Exudate Color: [1:Flat and Intact] [2:Flat and Intact] [3:Flat and Intact] Wound Margin: [1:Large (67-100%)] [2:Large (67-100%)] [3:Large (67-100%)] Granulation Amount: [1:Red] [2:Red] [3:Red] Granulation Quality: [1:Small (1-33%)] [2:None Present (0%)] [3:None Present (0%)] Necrotic Amount: [1:Fat Layer (Subcutaneous Tissue)] [2:Fat Layer (Subcutaneous Tissue)] [3:Fat Layer (Subcutaneous Tissue)] Exposed Structures: [1:Exposed: Yes Fascia: No Tendon: No Muscle: No Joint: No Bone: No Small (1-33%)] [2:Exposed: Yes Fascia: No Tendon: No Muscle: No Joint: No Bone: No Small (1-33%)] [3:Exposed: Yes Fascia: No Tendon: No Muscle: No Joint: No Bone: No Small (1-33%)] Treatment Notes Wound #1 (Left, Anterior Lower Leg) 1. Cleanse With Wound Cleanser 2. Periwound Care Moisturizing lotion TCA  Cream 3. Primary Dressing Applied Hydrofera Blue 4. Secondary Dressing ABD Pad 6. Support Layer Applied 3 layer compression wrap Notes netting Wound #2 (Left, Distal, Anterior Lower Leg) 1. Cleanse With Wound Cleanser 2. Periwound Care Moisturizing lotion TCA Cream 3. Primary Dressing Applied Hydrofera Blue 4. Secondary Dressing ABD Pad 6. Support Layer Applied 3 layer compression wrap Notes netting Wound #3 (Left Achilles) 1. Cleanse With Wound Cleanser 2. Periwound Care Moisturizing lotion TCA Cream 3. Primary Dressing Applied Hydrofera Blue 4. Secondary Dressing ABD Pad 6. Support Layer Applied 3 layer compression wrap Notes netting Electronic Signature(s) Signed: 12/08/2019 5:55:30 PM By: Baltazar Najjarobson, Michael MD Signed: 12/09/2019 9:54:21 AM By: Lauren PaxEpps, Carrie RN Entered By: Baltazar Najjarobson, Michael on 12/08/2019 16:23:35 -------------------------------------------------------------------------------- Multi-Disciplinary Care Plan Details Patient Name: Date of Service: [facility] 12/08/2019 2:45 PM Medical Record Number: [id number] Patient Account Number: [id number] Date of Birth/Sex: Treating RN: May 28, 1961 (59 y.o. Lauren FinnerF) Epps, Carrie Primary Care Willam Munford: Lauren BeamVyas, Lauren Other Clinician: Referring Madisyn Mawhinney: Treating Lester Platas/Extender: Lauren Brineobson, Michael Vyas, Lauren Brown in Treatment: 0 Active Inactive Electronic Signature(s) Signed: 12/09/2019 9:54:21 AM By: Lauren PaxEpps, Carrie RN Entered By: Lauren PaxEpps, Carrie on 12/08/2019 15:54:57 -------------------------------------------------------------------------------- Pain Assessment Details Patient Name: Date of Service: Lauren CoonsLYO NS, [facility] Lauren Brown 12/08/2019 2:45 PM Medical Record Number: [id number] Patient Account Number: [id number] Date of Birth/Sex: Treating RN: May 28, 1961 (59 y.o. Lauren StandardF) Boehlein, Linda Primary Care Vinnie Gombert: Lauren BeamVyas, Lauren Other Clinician: Referring Aristide Waggle: Treating Tyshauna Finkbiner/Extender: Lauren Ngobson, Michael Vyas, Lauren Brown in  Treatment: 0 Active Problems Location of Pain Severity and Description of Pain Patient Has Paino Yes Site Locations Pain Location: Pain in Ulcers With Dressing Change: Yes Duration of the Pain. Constant / Intermittento Intermittent Rate the pain. Current Pain Level: 0 Worst Pain Level: 9 Least Pain Level: 3 Character of Pain Describe the Pain: Tender, Throbbing Pain Management and Medication Current Pain Management: Other: tolerable Is the Current Pain Management Adequate: Adequate How does your wound impact your activities of daily livingo Sleep: No Bathing: No Appetite: No Relationship With Others: No Bladder Continence: No Emotions: No Bowel Continence: No Work: Yes Toileting: No Drive: No Dressing: No Hobbies: No Psychologist, prison and probation serviceslectronic Signature(s) Signed: 12/08/2019 5:45:30 PM By: Zenaida DeedBoehlein, Linda RN, BSN Entered By: Zenaida DeedBoehlein, Linda on 12/08/2019 15:33:01 -------------------------------------------------------------------------------- Patient/Caregiver Education Details Patient Name: Date of Service: Lauren CoonsLYO NS, SHA Lauren Brown 6/15/2021andnbsp2:45 PM Medical Record Number: [id number] Patient Account Number: [id number] Date of Birth/Gender: Treating RN: May 28, 1961 (59 y.o. Lauren FinnerF) Epps, Carrie Primary Care Physician: Lauren BeamVyas, Lauren Other Clinician: Referring Physician: Treating Physician/Extender: Duncan Dullobson, Michael Vyas, Lauren Brown in Treatment: 0 Education Assessment Education Provided To: Patient Education Topics Provided Wound/Skin Impairment: Methods: Explain/Verbal Responses: State content correctly Electronic Signature(s) Signed: 12/09/2019 9:54:21 AM By: Lauren PaxEpps, Carrie RN Entered By: Lauren PaxEpps, Carrie on 12/08/2019 15:55:03 -------------------------------------------------------------------------------- Wound Assessment Details Patient Name:  Date of Service: Joylene John Lauren Brown 12/08/2019 2:45 PM Medical Record Number: [id number] Patient Account Number: [id number] Date of  Birth/Sex: Treating RN: 25-Feb-1961 (59 y.o. Elam Dutch Primary Care Katalaya Beel: Jerene Bears Other Clinician: Referring Kylee Nardozzi: Treating Charly Hunton/Extender: Greta Doom Brown in Treatment: 0 Wound Status Wound Number: 1 Primary Etiology: Arterial Insufficiency Ulcer Wound Location: Left, Anterior Lower Leg Secondary Etiology: Venous Leg Ulcer Wounding Event: Gradually Appeared Wound Status: Open Date Acquired: 02/24/2019 Comorbid History: Peripheral Arterial Disease Brown Of Treatment: 0 Clustered Wound: No Wound Measurements Length: (cm) 2.3 Width: (cm) 2.6 Depth: (cm) 0.2 Area: (cm) 4.697 Volume: (cm) 0.939 % Reduction in Area: % Reduction in Volume: Epithelialization: Small (1-33%) Tunneling: No Undermining: No Wound Description Classification: Full Thickness Without Exposed Support Structures Wound Margin: Flat and Intact Exudate Amount: Medium Exudate Type: Serosanguineous Exudate Color: red, brown Foul Odor After Cleansing: No Slough/Fibrino No Wound Bed Granulation Amount: Large (67-100%) Exposed Structure Granulation Quality: Red Fascia Exposed: No Necrotic Amount: Small (1-33%) Fat Layer (Subcutaneous Tissue) Exposed: Yes Necrotic Quality: Adherent Slough Tendon Exposed: No Muscle Exposed: No Joint Exposed: No Bone Exposed: No Treatment Notes Wound #1 (Left, Anterior Lower Leg) 1. Cleanse With Wound Cleanser 2. Periwound Care Moisturizing lotion TCA Cream 3. Primary Dressing Applied Hydrofera Blue 4. Secondary Dressing ABD Pad 6. Support Layer Applied 3 layer compression wrap Notes netting Electronic Signature(s) Signed: 12/08/2019 5:45:30 PM By: Baruch Gouty RN, BSN Entered By: Baruch Gouty on 12/08/2019 15:28:35 -------------------------------------------------------------------------------- Wound Assessment Details Patient Name: Date of Service: Joylene John Lauren Brown 12/08/2019 2:45 PM Medical Record Number:  [id number] Patient Account Number: [id number] Date of Birth/Sex: Treating RN: Feb 21, 1961 (59 y.o. Elam Dutch Primary Care Adaline Trejos: Jerene Bears Other Clinician: Referring Tiffanny Lamarche: Treating Maisa Bedingfield/Extender: Greta Doom Brown in Treatment: 0 Wound Status Wound Number: 2 Primary Etiology: Arterial Insufficiency Ulcer Wound Location: Left, Distal, Anterior Lower Leg Secondary Etiology: Venous Leg Ulcer Wounding Event: Gradually Appeared Wound Status: Open Date Acquired: 02/24/2019 Comorbid History: Peripheral Arterial Disease Brown Of Treatment: 0 Clustered Wound: No Wound Measurements Length: (cm) 1.1 Width: (cm) 2.1 Depth: (cm) 0.1 Area: (cm) 1.814 Volume: (cm) 0.181 % Reduction in Area: % Reduction in Volume: Epithelialization: Small (1-33%) Tunneling: No Undermining: No Wound Description Classification: Full Thickness Without Exposed Support Structures Wound Margin: Flat and Intact Exudate Amount: Medium Exudate Type: Serosanguineous Exudate Color: red, brown Foul Odor After Cleansing: No Slough/Fibrino No Wound Bed Granulation Amount: Large (67-100%) Exposed Structure Granulation Quality: Red Fascia Exposed: No Necrotic Amount: None Present (0%) Fat Layer (Subcutaneous Tissue) Exposed: Yes Tendon Exposed: No Muscle Exposed: No Joint Exposed: No Bone Exposed: No Treatment Notes Wound #2 (Left, Distal, Anterior Lower Leg) 1. Cleanse With Wound Cleanser 2. Periwound Care Moisturizing lotion TCA Cream 3. Primary Dressing Applied Hydrofera Blue 4. Secondary Dressing ABD Pad 6. Support Layer Applied 3 layer compression wrap Notes netting Electronic Signature(s) Signed: 12/08/2019 5:45:30 PM By: Baruch Gouty RN, BSN Entered By: Baruch Gouty on 12/08/2019 15:29:51 -------------------------------------------------------------------------------- Wound Assessment Details Patient Name: Date of Service: Joylene John Lauren Brown 12/08/2019  2:45 PM Medical Record Number: [id number] Patient Account Number: [id number] Date of Birth/Sex: Treating RN: [birth year] (59 y.o. Elam Dutch Primary Care Isel Skufca: Jerene Bears Other Clinician: Referring Lashina Milles: Treating Jackson Fetters/Extender: Ernestina Patches, Lauren Brown in Treatment: 0 Wound Status Wound Number: 3 Primary Etiology: Arterial Insufficiency Ulcer Wound Location: Left Achilles Wound Status: Open Wounding Event: Gradually Appeared Comorbid History: Peripheral Arterial Disease Date Acquired: 09/24/2019 Brown Of Treatment:  0 Clustered Wound: No Wound Measurements Length: (cm) 2.2 Width: (cm) 4.6 Depth: (cm) 0.3 Area: (cm) 7.948 Volume: (cm) 2.384 % Reduction in Area: % Reduction in Volume: Epithelialization: Small (1-33%) Tunneling: No Undermining: No Wound Description Classification: Full Thickness Without Exposed Support Structures Wound Margin: Flat and Intact Exudate Amount: Medium Exudate Type: Serosanguineous Exudate Color: red, brown Wound Bed Granulation Amount: Large (67-100%) Granulation Quality: Red Necrotic Amount: None Present (0%) Foul Odor After Cleansing: No Slough/Fibrino No Exposed Structure Fascia Exposed: No Fat Layer (Subcutaneous Tissue) Exposed: Yes Tendon Exposed: No Muscle Exposed: No Joint Exposed: No Bone Exposed: No Treatment Notes Wound #3 (Left Achilles) 1. Cleanse With Wound Cleanser 2. Periwound Care Moisturizing lotion TCA Cream 3. Primary Dressing Applied Hydrofera Blue 4. Secondary Dressing ABD Pad 6. Support Layer Applied 3 layer compression wrap Notes netting Electronic Signature(s) Signed: 12/08/2019 5:45:30 PM By: Zenaida Deed RN, BSN Entered By: Zenaida Deed on 12/08/2019 15:31:26 -------------------------------------------------------------------------------- Vitals Details Patient Name: Date of Service: Thomes Cake [facility] Lauren Brown 12/08/2019 2:45 PM Medical Record Number: [id number] Patient  Account Number: [id number] Date of Birth/Sex: Treating RN: February 01, 1961 (59 y.o. Lauren Brown Primary Care Neng Albee: Lauren Brown Other Clinician: Referring Saben Donigan: Treating Kayron Hicklin/Extender: Lauren Brown Brown in Treatment: 0 Vital Signs Time Taken: 14:47 Temperature (F): 98.9 Height (in): 66 Pulse (bpm): 78 Source: Stated Respiratory Rate (breaths/min): 18 Weight (lbs): 200 Blood Pressure (mmHg): 150/85 Source: Stated Reference Range: 80 - 120 mg / dl Body Mass Index (BMI): 32.3 Electronic Signature(s) Signed: 12/08/2019 5:45:30 PM By: Zenaida Deed RN, BSN Entered By: Zenaida Deed on 12/08/2019 15:05:32

## 2019-12-09 NOTE — Progress Notes (Signed)
Lauren Brown ([id number]) Visit Report for 12/08/2019 Chief Complaint Document Details Patient Name: Date of Service: Lauren Brown, Lauren Brown 12/08/2019 2:45 PM Medical Record Number: [id number] Patient Account Number: [id number] Date of Birth/Sex: Treating RN: [birth year]Lauren Brown (59 y.o. Lauren Brown) Epps, Carrie Primary Care Provider: Doreen BeamVyas, Dhruv Other Clinician: Referring Provider: Treating Provider/Extender: Anne Ngobson, Shawnise Peterkin Vyas, Dhruv Weeks in Treatment: 0 Information Obtained from: Patient Chief Complaint 12/08/2019; patient is here for review of wounds on her left lower extremity and consideration of hyperbarics. Electronic Signature(s) Signed: 12/08/2019 5:55:30 PM By: Baltazar Najjarobson, Yaxiel Minnie MD Entered By: Baltazar Najjarobson, Zohaib Heeney on 12/08/2019 16:24:07 -------------------------------------------------------------------------------- HPI Details Patient Name: Date of Service: Lauren Brown, Lauren Brown 12/08/2019 2:45 PM Medical Record Number: [id number] Patient Account Number: [id number] Date of Birth/Sex: Treating RN: [birth year]Lauren Brown (59 y.o. Lauren Brown) Epps, Carrie Primary Care Provider: Doreen BeamVyas, Dhruv Other Clinician: Referring Provider: Treating Provider/Extender: Anne Ngobson, Glorine Hanratty Vyas, Dhruv Weeks in Treatment: 0 History of Present Illness HPI Description: ADMISSION 12/08/2019 This is a 59 year old woman who is not a diabetic. Her problem in her left leg began in December 2020. She developed a blister on her left anterior lower leg which rapidly expanded and became more painful. She ultimately came to the attention of vascular surgery at [facility] in [place]. She had an angiogram and had revascularization on 06/24/2019 with a left iliac artery stent. Shortly thereafter she became ill on 06/27/2019 and required admission to hospital until 1/61/21 with MSSA back to uremia and confusion. An MRI of the left leg done during this admission showed no abscess or osteomyelitis. She has been followed by Dr. Orvan Falconerampbell of infectious disease. She was  treated with cefazolin for her MSSA sepsis. She came to the attention of Dr. Marcha Soldersathey at [facility]. She received at least 5 Apligraf as well the patient that wound care at [facility]. Most recently she has been using silver collagen on the wounds. She was referred here for consideration of hyperbarics. Her most recent follow-up ABI on 5/18 showed an ABI 1.01. Marked improvement from her preoperative ABI on the left of 0.51. Past medical history the patient is not a diabetic she has eczema. She is a continued smoker 5 cigarettes/day. Electronic Signature(s) Signed: 12/08/2019 5:55:30 PM By: Baltazar Najjarobson, Armando Bukhari MD Entered By: Baltazar Najjarobson, Venora Kautzman on 12/08/2019 16:27:44 -------------------------------------------------------------------------------- Physical Exam Details Patient Name: Date of Service: Lauren Brown, Lauren Brown 12/08/2019 2:45 PM Medical Record Number: [id number] Patient Account Number: [id number] Date of Birth/Sex: Treating RN: [birth year]Lauren Brown (Lauren Brown) Epps, Carrie Primary Care Provider: Doreen BeamVyas, Dhruv Other Clinician: Referring Provider: Treating Provider/Extender: Anne Ngobson, Danniella Robben Vyas, Dhruv Weeks in Treatment: 0 Constitutional Patient is hypertensive.. Pulse regular and within target range for patient.Marland Kitchen. Respirations regular, non-labored and within target range.. Temperature is normal and within the target range for the patient.. Cardiovascular Popliteal and femoral pulses palpable on the left. Salas pedis pulses palpable on the left. Psychiatric appears at normal baseline. Notes Wound exam; the patient has 2 open areas on the left anterior lower leg. Healthy granulation. I think these were probably remanence of a more sizable wound at 1 point. On the left lateral ankle area she has another open wound. This has some depth but again healthy looking tissue. There is no evidence of surrounding infection. Electronic Signature(s) Signed: 12/08/2019 5:55:30 PM By: Baltazar Najjarobson, Rangel Echeverri  MD Entered By: Baltazar Najjarobson, Charlena Haub on 12/08/2019 16:28:56 -------------------------------------------------------------------------------- Physician Orders Details Patient Name: Date of Service: Lauren Brown, Lauren Brown 12/08/2019 2:45 PM Medical Record Number: [id number] Patient Account Number: [id number] Date of Birth/Sex: Treating RN: [birth year]Lauren Brown Primary Care Provider: [name] Other Clinician: Referring Provider: Treating Provider/Extender: Anne Ng Weeks in Treatment: 0 Verbal / Phone Orders: No Diagnosis Coding Discharge From [facility] Services Discharge from Wound Care Center - consult only Dressing Change Frequency Do not change entire dressing for one week. Wound Cleansing Clean wound with Wound Cleanser Primary Wound Dressing Wound #1 Left,Anterior Lower Leg Hydrofera Blue Wound #2 Left,Distal,Anterior Lower Leg Hydrofera Blue Wound #3 Left Achilles Hydrofera Blue Secondary Dressing Wound #1 Left,Anterior Lower Leg Dry Gauze Wound #2 Left,Distal,Anterior Lower Leg Dry Gauze Wound #3 Left Achilles Dry Gauze Edema Control Wound #1 Left,Anterior Lower Leg 3 Layer Compression System - Left Lower Extremity Wound #2 Left,Distal,Anterior Lower Leg 3 Layer Compression System - Left Lower Extremity Wound #3 Left Achilles 3 Layer Compression System - Left Lower Extremity Electronic Signature(s) Signed: 12/08/2019 5:55:30 PM By: Baltazar Najjar MD Signed: 12/09/2019 9:54:21 AM By: Yevonne Pax RN Entered By: Yevonne Pax on 12/08/2019 15:56:51 -------------------------------------------------------------------------------- Problem List Details Patient Name: Date of Service: Lauren Brown [facility] Brown 12/08/2019 2:45 PM Medical Record Number: [id number] Patient Account Number: [id number] Date of Birth/Sex: Treating RN: [birth year] (59 y.o. 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 12/08/2019 No Yes L97.812 Non-pressure chronic ulcer of other part of right lower leg with fat layer 12/08/2019 No Yes exposed L97.422 Non-pressure chronic ulcer of left heel and midfoot with fat layer exposed 12/08/2019 No Yes Inactive Problems Resolved Problems Electronic Signature(s) Signed: 12/08/2019 5:55:30 PM By: Baltazar Najjar MD Entered By: Baltazar Najjar on 12/08/2019 16:23:28 -------------------------------------------------------------------------------- Progress Note Details Patient Name: Date of Service: Lauren Brown Brown 12/08/2019 2:45 PM Medical Record Number: [id number] Patient Account Number: [id number] Date of Birth/Sex: Treating RN: [birth year]Lauren Brown (59 y.o. Lauren Brown Primary Care Provider: [name] Other Clinician: Referring Provider: Treating Provider/Extender: Anne Ng Weeks in Treatment: 0 Subjective Chief Complaint Information obtained from Patient 12/08/2019; patient is here for review of wounds on her left lower extremity and consideration of hyperbarics. History of Present Illness (HPI) ADMISSION 12/08/2019 This is a 59 year old woman who is not a diabetic. Her problem in her left leg began in December 2020. She developed a blister on her left anterior lower leg which rapidly expanded and became more painful. She ultimately came to the attention of vascular surgery at [facility] in [place]. She had an angiogram and had revascularization on 06/24/2019 with a left iliac artery stent. Shortly thereafter she became ill on 06/27/2019 and required admission to hospital until 1/61/21 with MSSA back to uremia and confusion. An MRI of the left leg done during this admission showed no abscess or osteomyelitis.  She has been followed by Dr. Orvan Falconer of infectious disease. She was treated with cefazolin for her MSSA sepsis. She came to the attention of Dr. Marcha Solders at the [facility] wound care center. She received at least 5 Apligraf as well the patient that wound care at [facility]. Most recently she has been using silver collagen on the wounds. She was referred here for consideration of hyperbarics. Her most recent follow-up ABI on 5/18 showed an ABI 1.01. Marked improvement from her preoperative ABI on the left of 0.51. Past medical history the patient is not a diabetic she has eczema. She is a continued smoker  5 cigarettes/day. Patient History Information obtained from Patient. Allergies No Known Allergies Family History Diabetes - Siblings, Lung Disease - Mother, No family history of Cancer, Heart Disease, Hereditary Spherocytosis, Hypertension, Kidney Disease, Seizures, Stroke, Thyroid Problems, Tuberculosis. Social History Current every day smoker - 5 cig per day, Marital Status - Married, Alcohol Use - Never, Drug Use - No History, Caffeine Use - Daily - coffee, soda. Medical History Cardiovascular Patient has history of Peripheral Arterial Disease Integumentary (Skin) Denies history of History of Burn Psychiatric Denies history of Anorexia/bulimia, Confinement Anxiety Hospitalization/Surgery History - peripheral stent placement. - tubal ligation, right oophrectomy. - bil bunion removed. Review of Systems (ROS) Constitutional Symptoms (General Health) Denies complaints or symptoms of Fatigue, Fever, Chills, Marked Weight Change. Eyes Complains or has symptoms of Glasses / Contacts - reading. Denies complaints or symptoms of Dry Eyes, Vision Changes. Ear/Nose/Mouth/Throat Denies complaints or symptoms of Chronic sinus problems or rhinitis. Respiratory Denies complaints or symptoms of Chronic or frequent coughs, Shortness of Breath. Cardiovascular Denies complaints or symptoms of Chest  pain. Gastrointestinal Denies complaints or symptoms of Frequent diarrhea, Nausea, Vomiting. Endocrine Denies complaints or symptoms of Heat/cold intolerance. Genitourinary Denies complaints or symptoms of Frequent urination. Integumentary (Skin) Complains or has symptoms of Wounds - left lower leg. Musculoskeletal Denies complaints or symptoms of Muscle Pain, Muscle Weakness. Neurologic Denies complaints or symptoms of Numbness/parasthesias. Psychiatric Denies complaints or symptoms of Claustrophobia, Suicidal. Objective Constitutional Patient is hypertensive.. Pulse regular and within target range for patient.Marland Kitchen Respirations regular, non-labored and within target range.. Temperature is normal and within the target range for the patient.. Vitals Time Taken: 2:47 PM, Height: 66 in, Source: Stated, Weight: 200 lbs, Source: Stated, BMI: 32.3, Temperature: 98.9 F, Pulse: 78 bpm, Respiratory Rate: 18 breaths/min, Blood Pressure: 150/85 mmHg. Cardiovascular Popliteal and femoral pulses palpable on the left. Salas pedis pulses palpable on the left. Psychiatric appears at normal baseline. General Notes: Wound exam; the patient has 2 open areas on the left anterior lower leg. Healthy granulation. I think these were probably remanence of a more sizable wound at 1 point. On the left lateral ankle area she has another open wound. This has some depth but again healthy looking tissue. There is no evidence of surrounding infection. Integumentary (Hair, Skin) Wound #1 status is Open. Original cause of wound was Gradually Appeared. The wound is located on the Left,Anterior Lower Leg. The wound measures 2.3cm length x 2.6cm width x 0.2cm depth; 4.697cm^2 area and 0.939cm^3 volume. There is Fat Layer (Subcutaneous Tissue) Exposed exposed. There is no tunneling or undermining noted. There is a medium amount of serosanguineous drainage noted. The wound margin is flat and intact. There is large (67-100%)  red granulation within the wound bed. There is a small (1-33%) amount of necrotic tissue within the wound bed including Adherent Slough. Wound #2 status is Open. Original cause of wound was Gradually Appeared. The wound is located on the [facility] Lower Leg. The wound measures 1.1cm length x 2.1cm width x 0.1cm depth; 1.814cm^2 area and 0.181cm^3 volume. There is Fat Layer (Subcutaneous Tissue) Exposed exposed. There is no tunneling or undermining noted. There is a medium amount of serosanguineous drainage noted. The wound margin is flat and intact. There is large (67-100%) red granulation within the wound bed. There is no necrotic tissue within the wound bed. Wound #3 status is Open. Original cause of wound was Gradually Appeared. The wound is located on the Left Achilles. The wound measures 2.2cm length x 4.6cm width x  0.3cm depth; 7.948cm^2 area and 2.384cm^3 volume. There is Fat Layer (Subcutaneous Tissue) Exposed exposed. There is no tunneling or undermining noted. There is a medium amount of serosanguineous drainage noted. The wound margin is flat and intact. There is large (67-100%) red granulation within the wound bed. There is no necrotic tissue within the wound bed. 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 Wound Cleansing: Clean wound with Wound Cleanser Primary Wound Dressing: Wound #1 Left,Anterior Lower Leg: Hydrofera Blue Wound #2 Left,Distal,Anterior Lower Leg: Hydrofera Blue Wound #3 Left Achilles: Hydrofera Blue Secondary Dressing: Wound #1 Left,Anterior Lower Leg: Dry  Gauze Wound #2 Left,Distal,Anterior Lower Leg: Dry Gauze Wound #3 Left Achilles: Dry Gauze Edema Control: Wound #1 Left,Anterior Lower Leg: 3 Layer Compression System - Left Lower Extremity Wound #2 Left,Distal,Anterior Lower Leg: 3 Layer Compression System - Left Lower Extremity Wound #3 Left Achilles: 3 Layer Compression System - Left Lower Extremity 1. The patient has 3 open wounds to find 2 anteriorly and 1 laterally just below the lateral malleolus. Clean looking wounds no debridement was required. 2. They been using silver collagen on this most recently. She came in with an Ace wrap. There is some edema in the wound area especially the ankle. I think these should be put under compression she apparently did not tolerate 4-layer compression well we will put a 3 layer on her today. 3. Hydrofera Blue to the all wound areas under 3 layer compression 4. Unfortunately PAD as a cause of wounds in the lower extremity is not a qualifying diagnosis for hyperbarics as far as insurances are concerned 5. After discussion the patient was satisfied going to see Dr. Marcha Solders at [facility]. She asked about more Apligraf but she will not qualify for this segment. I told her that she could come back and see [place] at any time but the clinic in [place] was doing a great job with her wounds I spent 40 minutes on review of this patient's past medical history; face-to-face evaluation and preparation of this record Electronic Signature(s) Signed: 12/08/2019 5:55:30 PM By: Baltazar Najjar MD Entered By: Baltazar Najjar on 12/08/2019 16:33:12 -------------------------------------------------------------------------------- HxROS Details Patient Name: Date of Service: Lauren Brown Brown 12/08/2019 2:45 PM Medical Record Number: [id number] Patient Account Number: [id number] Date of Birth/Sex: Treating RN: Lauren Brown, Lauren Brown Primary Care Provider: [name] Other Clinician: Referring  Provider: Treating Provider/Extender: Anne Ng Weeks in Treatment: 0 Information Obtained From Patient Constitutional Symptoms (General Health) Complaints and Symptoms: Negative for: Fatigue; Fever; Chills; Marked Weight Change Eyes Complaints and Symptoms: Positive for: Glasses / Contacts - reading Negative for: Dry Eyes; Vision Changes Ear/Nose/Mouth/Throat Complaints and Symptoms: Negative for: Chronic sinus problems or rhinitis Respiratory Complaints and Symptoms: Negative for: Chronic or frequent coughs; Shortness of Breath Cardiovascular Complaints and Symptoms: Negative for: Chest pain Medical History: Positive for: Peripheral Arterial Disease Gastrointestinal Complaints and Symptoms: Negative for: Frequent diarrhea; Nausea; Vomiting Endocrine Complaints and Symptoms: Negative for: Heat/cold intolerance Genitourinary Complaints and Symptoms: Negative for: Frequent urination Integumentary (Skin) Complaints and Symptoms: Positive for: Wounds - left lower leg Medical History: Negative for: History of Burn Musculoskeletal Complaints and  Symptoms: Negative for: Muscle Pain; Muscle Weakness Neurologic Complaints and Symptoms: Negative for: Numbness/parasthesias Psychiatric Complaints and Symptoms: Negative for: Claustrophobia; Suicidal Medical History: Negative for: Anorexia/bulimia; Confinement Anxiety Hematologic/Lymphatic Immunological Oncologic Immunizations Pneumococcal Vaccine: Received Pneumococcal Vaccination: No Implantable Devices No devices added Hospitalization / Surgery History Type of Hospitalization/Surgery peripheral stent placement tubal ligation, right oophrectomy bil bunion removed Family and Social History Cancer: No; Diabetes: Yes - Siblings; Heart Disease: No; Hereditary Spherocytosis: No; Hypertension: No; Kidney Disease: No; Lung Disease: Yes - Mother; Seizures: No; Stroke: No; Thyroid Problems: No;  Tuberculosis: No; Current every day smoker - 5 cig per day; Marital Status - Married; Alcohol Use: Never; Drug Use: No History; Caffeine Use: Daily - coffee, soda; Financial Concerns: No; Food, Clothing or Shelter Needs: No; Support System Lacking: No; Transportation Concerns: No Engineer, maintenance) Signed: 12/08/2019 5:45:30 PM By: Baruch Gouty RN, BSN Signed: 12/08/2019 5:55:30 PM By: Linton Ham MD Entered By: Baruch Gouty on 12/08/2019 15:16:10 -------------------------------------------------------------------------------- SuperBill Details Patient Name: Date of Service: Tiburcio Bash [place] 12/08/2019 Medical Record Number: [id number] Patient Account Number: [id number] Date of Birth/Sex: Treating RN: Lauren Brown[birth year] (59 y.o. 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 chronic ulcer of left heel and midfoot with fat layer exposed Facility Procedures CPT4 Code: [id number] Description: [id number] - WOUND CARE VISIT-LEV 5 EST PT Modifier: Quantity: 1 Physician Procedures : CPT4 Code Description Modifier [id number] WC PHYS LEVEL 3 NEW PT ICD-10 Diagnosis Description 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

## 2021-01-12 DIAGNOSIS — I890 Lymphedema, not elsewhere classified: Secondary | ICD-10-CM | POA: Insufficient documentation

## 2022-05-02 DIAGNOSIS — I83023 Varicose veins of left lower extremity with ulcer of ankle: Secondary | ICD-10-CM | POA: Insufficient documentation

## 2022-05-03 ENCOUNTER — Ambulatory Visit (INDEPENDENT_AMBULATORY_CARE_PROVIDER_SITE_OTHER): Payer: Medicare Other

## 2022-05-03 ENCOUNTER — Ambulatory Visit (INDEPENDENT_AMBULATORY_CARE_PROVIDER_SITE_OTHER): Payer: Medicare Other | Admitting: Orthopedic Surgery

## 2022-05-03 VITALS — BP 154/87 | HR 82 | Ht 67.0 in | Wt 205.6 lb

## 2022-05-03 DIAGNOSIS — M79662 Pain in left lower leg: Secondary | ICD-10-CM

## 2022-05-03 NOTE — Progress Notes (Signed)
New patient  Chief Complaint  Patient presents with   Leg Pain    Left lower leg/ankle pain. States she got a pimple on her leg 4 yrs ago and it manifested into skin break down and was seen by wound care at [facility]. Wants second opinion on leg now.    61 year old for female had a wound that developed into a terrible ulcer that required revascularization procedures which were done at [facility] and at [facility].  She presents now with complaints of stinging sensation in her left leg near the place of the most severe wound.  She showed some pictures of a severe full-thickness wound  She says that there may be some metal in the wound.  The stinging sensation does radiate up the leg at times  Past Medical History:  Diagnosis Date   Eczema    Hyperlipidemia    Hypertension    Peripheral vascular disease (HCC)    Past Surgical History:  Procedure Laterality Date    iliac artery stent     BUNIONECTOMY     TEE WITHOUT CARDIOVERSION N/A 07/15/2019   Procedure: TRANSESOPHAGEAL ECHOCARDIOGRAM (TEE);  Surgeon: Sande Rives, MD;  Location: [facility] ENDOSCOPY;  Service: Cardiovascular;  Laterality: N/A;   TONSILLECTOMY     Social History   Tobacco Use   Smoking status: Every Day   Smokeless tobacco: Never  Substance Use Topics   Alcohol use: Never   Drug use: Never    Current Outpatient Medications  Medication Instructions   augmented betamethasone dipropionate (DIPROLENE-AF) 0.05 % cream 1 application , Topical, 2 times daily, Applied to hands (Not to be applied to the face, groin, or underarm areas)   clopidogrel (PLAVIX) 75 mg, Oral, Daily   cyanocobalamin (VITAMIN B12) 500 mcg, Oral, Daily   SANTYL ointment 1 application , Topical, See admin instructions, Apply to wound of leg leg with each dressing change as directed   simvastatin (ZOCOR) 20 mg, Oral, Daily at bedtime   triamcinolone cream (KENALOG) 0.1 % 1 application , Topical, 2 times daily    BP (!) 154/87   Pulse 82   Ht 5'  7" (1.702 m)   Wt 205 lb 9.6 oz (93.3 kg)   BMI 32.20 kg/m   The left lower extremity has several areas of prior wounds with discoloration of the skin and what appears to me to be vascular related skin changes with thickening and firming up of the tissue  X-ray was taken to rule out foreign body which was done and there was no foreign body  I suspect the patient is a has scarring with involvement of the nerve or 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
